# Patient Record
Sex: Female | Born: 1985 | Race: White | Hispanic: No | Marital: Married | Smoking: Never smoker
Health system: Southern US, Community
[De-identification: ages and names within clinical notes are randomized; demographics above are authoritative.]

## PROBLEM LIST (undated history)

## (undated) DIAGNOSIS — T7840XA Allergy, unspecified, initial encounter: Secondary | ICD-10-CM

## (undated) DIAGNOSIS — J302 Other seasonal allergic rhinitis: Secondary | ICD-10-CM

## (undated) HISTORY — PX: TONSILLECTOMY: SUR1361

## (undated) HISTORY — DX: Other seasonal allergic rhinitis: J30.2

## (undated) HISTORY — DX: Allergy, unspecified, initial encounter: T78.40XA

---

## 2011-09-03 LAB — HM PAP SMEAR: HM PAP: NEGATIVE

## 2014-01-30 ENCOUNTER — Ambulatory Visit (INDEPENDENT_AMBULATORY_CARE_PROVIDER_SITE_OTHER): Payer: BC Managed Care – PPO | Admitting: Family Medicine

## 2014-01-30 ENCOUNTER — Encounter: Payer: Self-pay | Admitting: Family Medicine

## 2014-01-30 VITALS — BP 105/42 | HR 58 | Ht 64.0 in | Wt 143.0 lb

## 2014-01-30 DIAGNOSIS — H6980 Other specified disorders of Eustachian tube, unspecified ear: Secondary | ICD-10-CM | POA: Insufficient documentation

## 2014-01-30 DIAGNOSIS — H6981 Other specified disorders of Eustachian tube, right ear: Secondary | ICD-10-CM | POA: Diagnosis not present

## 2014-01-30 MED ORDER — MONTELUKAST SODIUM 10 MG PO TABS: 10.0000 mg | ORAL_TABLET | Freq: Every day | ORAL | Status: DC

## 2014-01-30 NOTE — Progress Notes (Signed)
CC: Regina Hogan is a 28 y.o. female is here for Establish Care   Subjective: HPI:  Very pleasant 28 year old here to establish care, wife of Regina Hogan  Patient complains of recurrent episodes of right tonsillar swelling and right ear to neck pain that occurs 3-4 times a year. Most recently occurred about a month ago. It is described as swelling, pain with swallowing and enlargement of the right tonsil. It has improved in the past with azithromycin but this no longer helps, most recently responded to prednisone. She relates that taking an over-the-counter antihistamine helps prevent the onset. Currently she denies any complaints. When it's present she denies any choking episodes or difficulty breathing. It seems to be worse during the fall and spring when her allergies are the worst. It also seems to have improved with Flonase however this is a class C medication and she is currently in her first trimester.  Review of Systems - General ROS: negative for - chills, fever, night sweats, weight gain or weight loss Ophthalmic ROS: negative for - decreased vision Psychological ROS: negative for - anxiety or depression ENT ROS: negative for - hearing change, nasal congestion, tinnitus Hematological and Lymphatic ROS: negative for - bleeding problems, bruising or swollen lymph nodes Breast ROS: negative Respiratory ROS: no cough, shortness of breath, or wheezing Cardiovascular ROS: no chest pain or dyspnea on exertion Gastrointestinal ROS: no abdominal pain, change in bowel habits, or black or bloody stools Genito-Urinary ROS: negative for - genital discharge, genital ulcers, incontinence or abnormal bleeding from genitals Musculoskeletal ROS: negative for - joint pain or muscle pain Neurological ROS: negative for - headaches or memory loss Dermatological ROS: negative for lumps, mole changes, rash and skin lesion changes  History reviewed. No pertinent past medical history.  History reviewed. No  pertinent past surgical history. Family History  Problem Relation Age of Onset  . Cancer - Other      grandfather   . Depression      grandmother   . Hyperlipidemia Mother     History   Social History  . Marital Status: Married    Spouse Name: N/A    Number of Children: N/A  . Years of Education: N/A   Occupational History  . Not on file.   Social History Main Topics  . Smoking status: Never Smoker   . Smokeless tobacco: Not on file  . Alcohol Use: No     Comment: not currently due to pregnancy  . Drug Use: No  . Sexual Activity:    Partners: Male   Other Topics Concern  . Not on file   Social History Narrative  . No narrative on file     Objective: BP 105/42 mmHg  Pulse 58  Ht 5\' 4"  (1.626 m)  Wt 143 lb (64.864 kg)  BMI 24.53 kg/m2  General: Alert and Oriented, No Acute Distress HEENT: Pupils equal, round, reactive to light. Conjunctivae clear.  External ears unremarkable, canals clear with intact TMs with appropriate landmarks.  Middle ear appears open without effusion. Pink inferior turbinates.  Moist mucous membranes, pharynx without inflammation nor lesions with only trace right tonsillar hypertrophy. Uvula is midline.  Neck supple without palpable lymphadenopathy nor abnormal masses. Extremities: No peripheral edema.  Strong peripheral pulses.  Mental Status: No depression, anxiety, nor agitation. Skin: Warm and dry.  Assessment & Plan: Brigitt was seen today for establish care.  Diagnoses and associated orders for this visit:  Eustachian tube dysfunction, right - montelukast (SINGULAIR) 10 MG tablet;  Take 1 tablet (10 mg total) by mouth at bedtime. Once in 2nd or 3rd trimester of pregnancy.    Eustachian tube dysfunction: currently stable but influenced by fluctuating allergies therefore Discussed starting Singulair at the beginning of her second trimester provided she gets the approval of her OB/GYN office at [facility]. Hopefully this will help  prevent future recurrences for now use nasal saline washes. Invited her to return here if she has any recurrence of the swelling in the right neck.  I've asked her to return on an as-needed basis or after her pregnancy for checking cholesterol and routine health maintenance outside of the pregnancy.  Return in about 8 months (around 09/30/2014), or if symptoms worsen or fail to improve.

## 2014-07-03 ENCOUNTER — Encounter: Payer: Self-pay | Admitting: Family Medicine

## 2014-07-03 ENCOUNTER — Ambulatory Visit (INDEPENDENT_AMBULATORY_CARE_PROVIDER_SITE_OTHER): Payer: BC Managed Care – PPO | Admitting: Family Medicine

## 2014-07-03 VITALS — BP 116/66 | HR 76 | Temp 98.0°F | Wt 181.0 lb

## 2014-07-03 DIAGNOSIS — H6982 Other specified disorders of Eustachian tube, left ear: Secondary | ICD-10-CM

## 2014-07-03 MED ORDER — CEFDINIR 300 MG PO CAPS: 300.0000 mg | ORAL_CAPSULE | Freq: Two times a day (BID) | ORAL | Status: AC

## 2014-07-03 NOTE — Progress Notes (Signed)
CC: Regina Hogan is a 29 y.o. female is here for left ear pain   Subjective: HPI:  Complains of left throat pain and swelling that has been present since Friday last week. Pain is described as sharp, moderate in severity worse when lying down at night and radiates into the left ear. She's tried Tylenol which mildly helps improve her pain. She denies fevers, chills, cough, shortness of breath, difficulty swallowing. She denies rash. Symptoms are worse when swallowing. She has a sensation of drainage down the back of her throat when lying down at night but denies nasal congestion.   Review Of Systems Outlined In HPI  No past medical history on file.  No past surgical history on file. Family History  Problem Relation Age of Onset  . Cancer - Other      grandfather   . Depression      grandmother   . Hyperlipidemia Mother     History   Social History  . Marital Status: Married    Spouse Name: N/A  . Number of Children: N/A  . Years of Education: N/A   Occupational History  . Not on file.   Social History Main Topics  . Smoking status: Never Smoker   . Smokeless tobacco: Not on file  . Alcohol Use: No     Comment: not currently due to pregnancy  . Drug Use: No  . Sexual Activity:    Partners: Male   Other Topics Concern  . Not on file   Social History Narrative  . No narrative on file     Objective: BP 116/66 mmHg  Pulse 76  Temp(Src) 98 F (36.7 C) (Oral)  Wt 181 lb (82.101 kg)  General: Alert and Oriented, No Acute Distress HEENT: Pupils equal, round, reactive to light. Conjunctivae clear.  External ears unremarkable, canals clear with intact TMs with appropriate landmarks.  Middle ear appears open without effusion. Pink inferior turbinates.  Moist mucous membranes, pharynx without lesions, uvula is midline, left tonsil is moderately erythematous and somewhat larger than the right..  Neck supple without palpable lymphadenopathy nor abnormal masses. Lungs:  Clear to auscultation bilaterally, no wheezing/ronchi/rales.  Comfortable work of breathing. Good air movement. Extremities: No peripheral edema.  Strong peripheral pulses.  Mental Status: No depression, anxiety, nor agitation. Skin: Warm and dry.  Assessment & Plan: Regina Hogan was seen today for left ear pain.  Diagnoses and all orders for this visit:  Eustachian tube dysfunction, left Orders: -     cefdinir (OMNICEF) 300 MG capsule; Take 1 capsule (300 mg total) by mouth 2 (two) times daily.   Tonsillitis causing eustachian tube dysfunction with question of bacterial sinusitis therefore start Cementon. She is [redacted] weeks pregnant which prohibits use of nonsteroidal anti-inflammatories or prednisone.  She's currently not taking Singulair or hernasal steroid, continue holding off on these and do not start Singulair after delivering if desiring to breast-feed.  Return if symptoms worsen or fail to improve.

## 2014-07-04 ENCOUNTER — Telehealth: Payer: Self-pay | Admitting: *Deleted

## 2014-07-04 NOTE — Telephone Encounter (Signed)
This is ok because the cefdinir that I Rxed is in the same class as cephalexin that was given to her by a former provider and she didn't have a allergic reaction to it. This was on some of the paperwork she showed me on Wednesday.

## 2014-07-04 NOTE — Telephone Encounter (Signed)
Pt aware.

## 2014-07-04 NOTE — Telephone Encounter (Signed)
Pt called and states the pharmacist asked if she had a PCN allergy. She wanted to double check that its safe for her to take this med with PCN allergy as a child and pregnancy

## 2015-02-26 ENCOUNTER — Encounter: Payer: Self-pay | Admitting: Family Medicine

## 2015-02-26 ENCOUNTER — Other Ambulatory Visit: Payer: Self-pay

## 2015-02-26 ENCOUNTER — Ambulatory Visit: Payer: BC Managed Care – PPO | Admitting: Family Medicine

## 2015-02-26 ENCOUNTER — Ambulatory Visit (INDEPENDENT_AMBULATORY_CARE_PROVIDER_SITE_OTHER): Payer: BC Managed Care – PPO | Admitting: Family Medicine

## 2015-02-26 VITALS — BP 114/71 | HR 74 | Wt 147.0 lb

## 2015-02-26 DIAGNOSIS — R251 Tremor, unspecified: Secondary | ICD-10-CM

## 2015-02-26 DIAGNOSIS — R42 Dizziness and giddiness: Secondary | ICD-10-CM

## 2015-02-26 LAB — COMPLETE METABOLIC PANEL WITH GFR
ALT: 12 U/L (ref 6–29)
AST: 17 U/L (ref 10–30)
Albumin: 3.9 g/dL (ref 3.6–5.1)
Alkaline Phosphatase: 32 U/L — ABNORMAL LOW (ref 33–115)
BILIRUBIN TOTAL: 1.4 mg/dL — AB (ref 0.2–1.2)
BUN: 14 mg/dL (ref 7–25)
CHLORIDE: 100 mmol/L (ref 98–110)
CO2: 21 mmol/L (ref 20–31)
CREATININE: 0.7 mg/dL (ref 0.50–1.10)
Calcium: 9.1 mg/dL (ref 8.6–10.2)
GFR, Est African American: >89 mL/min (ref >=60)
GFR, Est Non African American: >89 mL/min (ref >=60)
GLUCOSE: 90 mg/dL (ref 65–99)
Potassium: 3.6 mmol/L (ref 3.5–5.3)
SODIUM: 137 mmol/L (ref 135–146)
TOTAL PROTEIN: 6.8 g/dL (ref 6.1–8.1)

## 2015-02-26 LAB — HEMOGLOBIN: Hemoglobin: 14.2 g/dL (ref 12.0–15.0)

## 2015-02-26 MED ORDER — PREDNISONE 20 MG PO TABS: ORAL_TABLET | ORAL | Status: AC

## 2015-02-26 MED ORDER — SODIUM CHLORIDE 0.9 % IV SOLN: 125.0000 mg | Freq: Once | INTRAVENOUS | Status: DC

## 2015-02-26 MED ORDER — METHYLPREDNISOLONE SODIUM SUCC 125 MG IJ SOLR
125.0000 mg | Freq: Once | INTRAMUSCULAR | Status: AC
  Administered 2015-02-26: 125 mg via INTRAMUSCULAR

## 2015-02-26 NOTE — Addendum Note (Signed)
Addended by: Delrae Alfred on: 02/26/2015 11:34 AM   Modules accepted: Orders

## 2015-02-26 NOTE — Progress Notes (Signed)
CC: Regina Hogan is a 29 y.o. female is here for Ear Fullness   Subjective: HPI:   complains of a sensation of  Mild dizziness and ear fullness, further described as feeling like she needs to to pressurize her ears. Symptoms have happened today and yesterday. They come on late in the morning after she's been awake and had breakfast, it comes on without warning. Usually lasts a few hours and then goes away. She feels like she is in her regular state of health otherwise. Symptoms are mild in severity. Nothing seems to make it better but it's worse when closing her eyes.  She denies sinus pressure, sore throat, cough, visual disturbance or any other motor or sensory disturbances. Denies change in hearing. Denies ringing in the ears. No interventions as of yet.   Review Of Systems Outlined In HPI  No past medical history on file.  No past surgical history on file. Family History  Problem Relation Age of Onset  . Cancer - Other      grandfather   . Depression      grandmother   . Hyperlipidemia Mother     Social History   Social History  . Marital Status: Married    Spouse Name: N/A  . Number of Children: N/A  . Years of Education: N/A   Occupational History  . Not on file.   Social History Main Topics  . Smoking status: Never Smoker   . Smokeless tobacco: Not on file  . Alcohol Use: No     Comment: not currently due to pregnancy  . Drug Use: No  . Sexual Activity:    Partners: Male   Other Topics Concern  . Not on file   Social History Narrative     Objective: BP 114/71 mmHg  Pulse 74  Wt 147 lb (66.679 kg)  General: Alert and Oriented, No Acute Distress HEENT: Pupils equal, round, reactive to light. Conjunctivae clear.  External ears unremarkable, canals clear with intact TMs with appropriate landmarks.  Middle ear appears open without effusion. Pink inferior turbinates.  Moist mucous membranes, pharynx without inflammation nor lesions.  Neck supple without  palpable lymphadenopathy nor abnormal masses. Lungs:   Clear and  Comfortable work of breathing Cardiac: Regular rate and rhythm. No murmurs Neuro: CN II-XII grossly intact, full strength/rom of all four extremities, C5/L4/S1 DTRs 2/4 bilaterally, gait normal, rapid alternating movements normal, heel-shin test normal, Rhomberg abnormal. gait normal. Extremities: No peripheral edema.  Strong peripheral pulses.  Mild tremor with hand extension. Mental Status: No depression, anxiety, nor agitation. Skin: Warm and dry.  Assessment & Plan: Lucrezia was seen today for ear fullness.  Diagnoses and all orders for this visit:  Tremor -     COMPLETE METABOLIC PANEL WITH GFR -     Hemoglobin  Dizziness -     COMPLETE METABOLIC PANEL WITH GFR -     Hemoglobin -     predniSONE (DELTASONE) 20 MG tablet; Three tabs at once daily for five days.   Tremor: Rule out hepatic or kidney abnormality, rule out hypoglycemia. Dizziness is most likely due to eustachian tube dysfunction therefore she was given a injection of Solu-Medrol today and started on a five-day course of prednisone. Will rule out electrolyte abnormality along with anemia.   Return if symptoms worsen or fail to improve.

## 2015-03-07 ENCOUNTER — Ambulatory Visit (INDEPENDENT_AMBULATORY_CARE_PROVIDER_SITE_OTHER): Payer: BC Managed Care – PPO | Admitting: Family Medicine

## 2015-03-07 ENCOUNTER — Encounter: Payer: Self-pay | Admitting: Family Medicine

## 2015-03-07 VITALS — BP 120/78 | HR 67 | Wt 143.0 lb

## 2015-03-07 DIAGNOSIS — G44209 Tension-type headache, unspecified, not intractable: Secondary | ICD-10-CM | POA: Diagnosis not present

## 2015-03-07 MED ORDER — METHOCARBAMOL 500 MG PO TABS: 500.0000 mg | ORAL_TABLET | Freq: Four times a day (QID) | ORAL | Status: DC | PRN

## 2015-03-07 MED ORDER — TRAMADOL HCL 50 MG PO TABS: 50.0000 mg | ORAL_TABLET | Freq: Three times a day (TID) | ORAL | Status: DC | PRN

## 2015-03-07 NOTE — Progress Notes (Signed)
CC: Regina Hogan is a 29 y.o. female is here for Headache   Subjective: HPI:   Headache that she woke up with but not from on Tuesday. Symptoms are persistent ever since onset. No benefit from nonsteroidal anti-inflammatories, tramadol helps somewhat. It also helps if she holds her head with her fingers or if she gets massages on her neck. She tells me it feels like she has a band wrapped around her head. She denies worse headache of her life or any motor or sensory disturbances. She's no longer his parents and dizziness or tremor after taking prednisone. She denies visual disturbance or weakness. She denies any recent or remote exertion or trauma.   Review Of Systems Outlined In HPI  No past medical history on file.  No past surgical history on file. Family History  Problem Relation Age of Onset  . Cancer - Other      grandfather   . Depression      grandmother   . Hyperlipidemia Mother     Social History   Social History  . Marital Status: Married    Spouse Name: N/A  . Number of Children: N/A  . Years of Education: N/A   Occupational History  . Not on file.   Social History Main Topics  . Smoking status: Never Smoker   . Smokeless tobacco: Not on file  . Alcohol Use: No     Comment: not currently due to pregnancy  . Drug Use: No  . Sexual Activity:    Partners: Male   Other Topics Concern  . Not on file   Social History Narrative     Objective: BP 120/78 mmHg  Pulse 67  Wt 143 lb (64.864 kg)  General: Alert and Oriented, No Acute Distress HEENT: Pupils equal, round, reactive to light. Conjunctivae clear.  External ears unremarkable, canals clear with intact TMs with appropriate landmarks.  Middle ear appears open without effusion. Pink inferior turbinates.  Moist mucous membranes, pharynx without inflammation nor lesions.  Neck supple without palpable lymphadenopathy nor abnormal masses. Neuro: CN II-XII grossly intact, full strength/rom of all four  extremities, C5/L4/S1 DTRs 2/4 bilaterally, gait normal, rapid alternating movements normal, heel-shin test normal, Rhomberg normal. Extremities: No peripheral edema.  Strong peripheral pulses.  Mental Status: No depression, anxiety, nor agitation. Skin: Warm and dry.  Assessment & Plan: Regina Hogan was seen today for headache.  Diagnoses and all orders for this visit:  Tension headache  Other orders -     traMADol (ULTRAM) 50 MG tablet; Take 1 tablet (50 mg total) by mouth every 8 (eight) hours as needed. -     methocarbamol (ROBAXIN) 500 MG tablet; Take 1 tablet (500 mg total) by mouth 4 (four) times daily as needed (headache).   Tension headache: Start Robaxin may continue tramadol and continue getting massages at least once a day.Signs and symptoms requring emergent/urgent reevaluation were discussed with the patient.   Return if symptoms worsen or fail to improve.

## 2015-03-24 ENCOUNTER — Encounter: Payer: Self-pay | Admitting: Family Medicine

## 2015-03-28 ENCOUNTER — Ambulatory Visit (INDEPENDENT_AMBULATORY_CARE_PROVIDER_SITE_OTHER): Payer: BC Managed Care – PPO | Admitting: Family Medicine

## 2015-03-28 ENCOUNTER — Encounter: Payer: Self-pay | Admitting: Family Medicine

## 2015-03-28 ENCOUNTER — Ambulatory Visit: Payer: BC Managed Care – PPO | Admitting: Family Medicine

## 2015-03-28 VITALS — BP 112/73 | HR 80 | Wt 143.0 lb

## 2015-03-28 DIAGNOSIS — R51 Headache: Secondary | ICD-10-CM | POA: Diagnosis not present

## 2015-03-28 DIAGNOSIS — R519 Headache, unspecified: Secondary | ICD-10-CM

## 2015-03-28 MED ORDER — MAGNESIUM 300 MG PO CAPS: ORAL_CAPSULE | ORAL | Status: AC

## 2015-03-28 NOTE — Progress Notes (Signed)
CC: Regina Hogan is a 30 y.o. female is here for Continued Headaches   Subjective: HPI:  Continues to have a headache most days of the week.  Initially improved after seeing me last and getting a massage however returned on the 2nd of January.  Felt in the back of the neck, back of the scalp, improves with pressing on the site of pain.  Nothing else makes better or wors other than acetaminophen slightly improving headache. She denies awakening because of the headache, she denies worst headache of life, she denies vision disturbance or confusion. There's been no fevers, chills, nasal congestion, sore throat or rash. She denies coordination difficulties or tremor. She denies any radiation of her pain in the arms. There are some days she'll have no headache whatsoever.   Review Of Systems Outlined In HPI  No past medical history on file.  No past surgical history on file. Family History  Problem Relation Age of Onset  . Cancer - Other      grandfather   . Depression      grandmother   . Hyperlipidemia Mother     Social History   Social History  . Marital Status: Married    Spouse Name: N/A  . Number of Children: N/A  . Years of Education: N/A   Occupational History  . Not on file.   Social History Main Topics  . Smoking status: Never Smoker   . Smokeless tobacco: Not on file  . Alcohol Use: No     Comment: not currently due to pregnancy  . Drug Use: No  . Sexual Activity:    Partners: Male   Other Topics Concern  . Not on file   Social History Narrative     Objective: BP 112/73 mmHg  Pulse 80  Wt 143 lb (64.864 kg)  Vital signs reviewed. General: Alert and Oriented, No Acute Distress HEENT: Pupils equal, round, reactive to light. Conjunctivae clear.  External ears unremarkable.  Moist mucous membranes. Lungs: Clear and comfortable work of breathing, speaking in full sentences without accessory muscle use. Cardiac: Regular rate and rhythm.  Neuro: CN II-XII  grossly intact, full strength/rom of all four extremities, C5/L4/S1 DTRs 2/4 bilaterally, gait normal, rapid alternating movements normal, heel-shin test normal, Rhomberg normal. Extremities: No peripheral edema.  Strong peripheral pulses.  Mental Status: No depression, anxiety, nor agitation. Logical though process. Skin: Warm and dry. Assessment & Plan: Andie was seen today for continued headaches.  Diagnoses and all orders for this visit:  New onset headache -     CT Head Wo Contrast; Future -     Magnesium 300 MG CAPS; One by mouth daily.   I really feel like her headache is still most likely due to a tension headache. If she does not get better after starting the magnesium supplement every day or his acute worsening of her headache I would like her to get a CT scan on Monday .I've asked her also to update me on Monday if she is doing better or worse.   Return if symptoms worsen or fail to improve.

## 2015-03-31 ENCOUNTER — Ambulatory Visit (HOSPITAL_BASED_OUTPATIENT_CLINIC_OR_DEPARTMENT_OTHER)
Admission: RE | Admit: 2015-03-31 | Discharge: 2015-03-31 | Disposition: A | Discharge status: Home / Self Care | Payer: BC Managed Care – PPO | Source: Ambulatory Visit | Attending: Family Medicine | Admitting: Family Medicine

## 2015-03-31 ENCOUNTER — Ambulatory Visit (HOSPITAL_BASED_OUTPATIENT_CLINIC_OR_DEPARTMENT_OTHER): Admission: RE | Admit: 2015-03-31 | Payer: BC Managed Care – PPO | Source: Ambulatory Visit

## 2015-03-31 ENCOUNTER — Encounter (HOSPITAL_BASED_OUTPATIENT_CLINIC_OR_DEPARTMENT_OTHER): Payer: Self-pay

## 2015-03-31 ENCOUNTER — Telehealth: Payer: Self-pay | Admitting: Family Medicine

## 2015-03-31 DIAGNOSIS — R519 Headache, unspecified: Secondary | ICD-10-CM

## 2015-03-31 DIAGNOSIS — R42 Dizziness and giddiness: Secondary | ICD-10-CM | POA: Insufficient documentation

## 2015-03-31 DIAGNOSIS — R51 Headache: Secondary | ICD-10-CM | POA: Insufficient documentation

## 2015-03-31 MED ORDER — AMITRIPTYLINE HCL 10 MG PO TABS: 10.0000 mg | ORAL_TABLET | Freq: Every day | ORAL | Status: DC

## 2015-03-31 NOTE — Telephone Encounter (Signed)
Will you please let patient know that her CT scan was normal and reassuring.  Since i assume she's still having headaches I'll send a headache preventative medicine called amitriptyline to her [facility].

## 2015-04-01 NOTE — Telephone Encounter (Signed)
Pt.notified

## 2015-04-01 NOTE — Telephone Encounter (Signed)
Pt does not have a vm. 

## 2015-04-07 ENCOUNTER — Encounter: Payer: Self-pay | Admitting: Family Medicine

## 2015-04-26 ENCOUNTER — Other Ambulatory Visit: Payer: Self-pay | Admitting: Family Medicine

## 2015-05-07 ENCOUNTER — Encounter: Payer: Self-pay | Admitting: Family Medicine

## 2015-05-09 MED ORDER — NORETHIN-ETH ESTRAD-FE BIPHAS 1 MG-10 MCG / 10 MCG PO TABS: 1.0000 | ORAL_TABLET | Freq: Every day | ORAL | Status: DC

## 2015-06-21 ENCOUNTER — Other Ambulatory Visit: Payer: Self-pay | Admitting: Family Medicine

## 2015-07-21 ENCOUNTER — Encounter: Payer: Self-pay | Admitting: Family Medicine

## 2015-07-21 MED ORDER — NORETHIN ACE-ETH ESTRAD-FE 1-20 MG-MCG(24) PO CAPS: 1.0000 | ORAL_CAPSULE | Freq: Every day | ORAL | Status: DC

## 2015-09-30 ENCOUNTER — Ambulatory Visit (INDEPENDENT_AMBULATORY_CARE_PROVIDER_SITE_OTHER): Payer: BC Managed Care – PPO | Admitting: Family Medicine

## 2015-09-30 ENCOUNTER — Encounter: Payer: Self-pay | Admitting: Family Medicine

## 2015-09-30 VITALS — BP 107/71 | HR 57 | Wt 142.0 lb

## 2015-09-30 DIAGNOSIS — Z23 Encounter for immunization: Secondary | ICD-10-CM

## 2015-09-30 DIAGNOSIS — Z0000 Encounter for general adult medical examination without abnormal findings: Secondary | ICD-10-CM

## 2015-09-30 DIAGNOSIS — Z021 Encounter for pre-employment examination: Secondary | ICD-10-CM

## 2015-09-30 DIAGNOSIS — Z111 Encounter for screening for respiratory tuberculosis: Secondary | ICD-10-CM | POA: Diagnosis not present

## 2015-09-30 NOTE — Addendum Note (Signed)
Addended by: Delrae Alfred on: 09/30/2015 10:00 AM   Modules accepted: Orders, SmartSet

## 2015-09-30 NOTE — Progress Notes (Signed)
CC: Regina Hogan is a 30 y.o. 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 She needs a form filled out stating that she is healthy to start this position. She tells me that she needs a TDAP given since it's been greater than 1 year since her last TdAP. She also has to have a PPD placed. She has no complaints today.  Review of Systems - General ROS: negative for - chills, fever, night sweats, weight gain or weight loss Ophthalmic ROS: negative for - decreased vision Psychological ROS: negative for - anxiety or depression ENT ROS: negative for - hearing change, nasal congestion, tinnitus or allergies Hematological and Lymphatic ROS: negative for - bleeding problems, bruising or swollen lymph nodes Breast ROS: negative Respiratory ROS: no cough, shortness of breath, or wheezing Cardiovascular ROS: no chest pain or dyspnea on exertion Gastrointestinal ROS: no abdominal pain, change in bowel habits, or black or bloody stools Genito-Urinary ROS: negative for - genital discharge, genital ulcers, incontinence or abnormal bleeding from genitals Musculoskeletal ROS: negative for - joint pain or muscle pain Neurological ROS: negative for - headaches or memory loss Dermatological ROS: negative for lumps, mole changes, rash and skin lesion changes  History reviewed. No pertinent past medical history.  History reviewed. No pertinent past surgical history. Family History  Problem Relation Age of Onset  . Cancer - Other      grandfather   . Depression      grandmother   . Hyperlipidemia Mother     Social History   Social History  . Marital Status: Married    Spouse Name: N/A  . Number of Children: N/A  . Years of Education: N/A   Occupational History  . Not on file.   Social History Main Topics  . Smoking status: Never Smoker   . Smokeless tobacco: Not on file  . Alcohol Use: No     Comment: not currently  due to pregnancy  . Drug Use: No  . Sexual Activity:    Partners: Male   Other Topics Concern  . Not on file   Social History Narrative     Objective: BP 107/71 mmHg  Pulse 57  Wt 142 lb (64.411 kg)  General: Alert and Oriented, No Acute Distress HEENT: Pupils equal, round, reactive to light. Conjunctivae clear.  External ears unremarkable, canals clear with intact TMs with appropriate landmarks.  Middle ear appears open without effusion. Pink inferior turbinates.  Moist mucous membranes, pharynx without inflammation nor lesions.  Neck supple without palpable lymphadenopathy nor abnormal masses. Lungs: Clear to auscultation bilaterally, no wheezing/ronchi/rales.  Comfortable work of breathing. Good air movement. Cardiac: Regular rate and rhythm. Normal S1/S2.  No murmurs, rubs, nor gallops.   Extremities: No peripheral edema.  Strong peripheral pulses.  Mental Status: No depression, anxiety, nor agitation. Skin: Warm and dry.  Assessment & Plan: Regina Hogan was seen today for annual exam.  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 She'll return on Thursday or Friday to have her PPD read. I will be out of the office and will leave her signed form in my inbox for one our assistants to complete for her.  While leaving she felt lightheaded and had a vasovagal syncope episode. She was caught by myself and [place] and did  not hit any structure with any force.  She was unresponsive for less than a second and had a normal BP and pulse once her legs were lifted and she was lying on the floor.  She was observed back in her normal state of health without any complaints for 15 minutes before leaving.  She reports this has happened in the past with injections.   Return for Thursday or Friday Nurse Visit (PPD).

## 2015-09-30 NOTE — Addendum Note (Signed)
Addended by: Delrae Alfred on: 09/30/2015 10:00 AM   Modules accepted: Miquel Dunn

## 2015-10-02 ENCOUNTER — Ambulatory Visit (INDEPENDENT_AMBULATORY_CARE_PROVIDER_SITE_OTHER): Payer: BC Managed Care – PPO | Admitting: Sports Medicine

## 2015-10-02 VITALS — BP 108/66 | HR 68 | Wt 142.0 lb

## 2015-10-02 DIAGNOSIS — Z111 Encounter for screening for respiratory tuberculosis: Secondary | ICD-10-CM

## 2015-10-02 DIAGNOSIS — Z7689 Persons encountering health services in other specified circumstances: Secondary | ICD-10-CM | POA: Diagnosis not present

## 2015-10-02 LAB — TB SKIN TEST
INDURATION: 0 mm
TB Skin Test: NEGATIVE

## 2015-10-02 NOTE — Progress Notes (Signed)
   Subjective:    Patient ID: Regina Hogan, female    DOB: 09-10-1985, 30 y.o.   MRN: [id number]  HPI  Regina Hogan presents to the clinic for a PPD reading.  Denies any redness, SOB, and fever.  There is a bruise where the injection was given. Pt reading was negative 0 mm. -[facility]/RMA  Review of Systems     Objective:   Physical Exam        Assessment & Plan:

## 2016-01-16 ENCOUNTER — Encounter: Payer: Self-pay | Admitting: Osteopathic Medicine

## 2016-01-16 ENCOUNTER — Ambulatory Visit (INDEPENDENT_AMBULATORY_CARE_PROVIDER_SITE_OTHER): Payer: BC Managed Care – PPO | Admitting: Osteopathic Medicine

## 2016-01-16 VITALS — BP 110/75 | HR 82 | Temp 98.6°F | Ht 64.0 in | Wt 146.0 lb

## 2016-01-16 DIAGNOSIS — J029 Acute pharyngitis, unspecified: Secondary | ICD-10-CM | POA: Diagnosis not present

## 2016-01-16 MED ORDER — LIDOCAINE VISCOUS HCL 2 % MT SOLN: 4.5000 mg/kg | OROMUCOSAL | 0 refills | Status: DC | PRN

## 2016-01-16 MED ORDER — CEPHALEXIN 500 MG PO TABS: 500.0000 mg | ORAL_TABLET | Freq: Two times a day (BID) | ORAL | 0 refills | Status: DC

## 2016-01-16 NOTE — Progress Notes (Signed)
HPI: Regina Hogan is a 30 y.o. female who presents to [place] 01/16/16 for chief complaint of:  Chief Complaint  Patient presents with  . Sore Throat  . Ear Pain  . Fever    Acute Illness: . Context: went to [facility] over this week Given azithromycin, doesn't seem to make any difference . Location: Throat/pharynx . Quality: achiness, fever, ear pain and sore throat  . Assoc signs/symptoms: see ROS    Past medical, social and family history reviewed. Current medications and allergies reviewed.     Review of Systems:  Constitutional: no fever/chills now but +fever earlier  HEENT: no headache, no vision change or hearing change, yes sore throat  Cardiovascular: No chest pain, No pressure/palpitations  Respiratory: no cough, no shortness of breath  Gastrointestinal: no nausea, no vomiting, no abdominal pain, no diarrhea  Musculoskeletal: no myalgia/arthralgia  Skin/Integument: no rash   Exam:  BP 110/75   Pulse 82   Temp 98.6 F (37 C) (Oral)   Ht 5\' 4"  (1.626 m)   Wt 146 lb (66.2 kg)   BMI 25.06 kg/m   Constitutional: VSS, see above. General Appearance: alert, well-developed, well-nourished, NAD  Eyes: Normal lids and conjunctive, non-icteric sclera, PERRLA  Ears, Nose, Mouth, Throat: Normal external inspection ears/nares/mouth/lips/gums, normal TM, MMM; posterior pharynx with erythema, without exudate, nasal mucosa normal  Neck: No masses, trachea midline. tender anterior lymph nodes  Respiratory: Normal respiratory effort. No  wheeze/rhonchi/rales  Cardiovascular: S1/S2 normal, no murmur/rub/gallop auscultated. RRR.  MODIFIED CENTOR CRITERIA (ponts if "yes"): Tonsillar erythema/exudate (1): yes Tender Ant Cervical LN (1): yes Absence of cough (1): yes Fever (1): yes Age  81-14 (1): no 15-45 (0): yes >/= 45 (-1): no SCORE: 4    ASSESSMENT/PLAN: Meeting Centor criteria for strep pharyngitis,  supportive care and antibiotics advised.  Acute pharyngitis, unspecified etiology - Plan: Lidocaine HCl 2 % SOLN, Cephalexin 500 MG tablet    Visit summary was printed for the patient with medications and pertinent instructions for patient to review. ER/RTC precautions reviewed. All questions answered. Return if symptoms worsen or fail to improve.

## 2016-02-06 ENCOUNTER — Ambulatory Visit: Payer: BC Managed Care – PPO | Admitting: Osteopathic Medicine

## 2016-02-17 ENCOUNTER — Ambulatory Visit (INDEPENDENT_AMBULATORY_CARE_PROVIDER_SITE_OTHER): Payer: BC Managed Care – PPO | Admitting: Osteopathic Medicine

## 2016-02-17 ENCOUNTER — Encounter: Payer: Self-pay | Admitting: Osteopathic Medicine

## 2016-02-17 VITALS — BP 110/56 | HR 60 | Ht 64.0 in | Wt 149.0 lb

## 2016-02-17 DIAGNOSIS — N926 Irregular menstruation, unspecified: Secondary | ICD-10-CM | POA: Diagnosis not present

## 2016-02-17 MED ORDER — NORGESTREL-ETHINYL ESTRADIOL 0.3-30 MG-MCG PO TABS: 1.0000 | ORAL_TABLET | Freq: Every day | ORAL | 11 refills | Status: DC

## 2016-02-17 NOTE — Progress Notes (Signed)
HPI: Regina Hogan is a 30 y.o. female  who presents to [place] today, 02/17/16,  for chief complaint of:  Chief Complaint  Patient presents with  . Establish Care    switching form [place]/ irregular periods     . Context: Irregular bleeding on birth control . Quality: Spotting between periods  . Duration: 6 months  . Modifying factors: Reviewed previous medication history for other birth control tried. Patient did well on Cryselle, this was stopped due to concern for headaches however patient was initiating several other headache therapies and stress modification techniques at the time, can't be sure that birth control change was what resolved the headache issue . Assoc signs/symptoms: No abdominal pain    Past medical, surgical, social and family history reviewed: Patient Active Problem List   Diagnosis Date Noted  . Eustachian tube dysfunction 01/30/2014   History reviewed. No pertinent surgical history. Social History  Substance Use Topics  . Smoking status: Never Smoker  . Smokeless tobacco: Never Used  . Alcohol use No     Comment: not currently due to pregnancy   Family History  Problem Relation Age of Onset  . Cancer - Other      grandfather   . Depression      grandmother   . Hyperlipidemia Mother      Current medication list and allergy/intolerance information reviewed:   Current Outpatient Prescriptions on File Prior to Visit  Medication Sig Dispense Refill  . fluticasone (FLONASE) 50 MCG/ACT nasal spray Place into both nostrils daily.    . Levocetirizine Dihydrochloride (XYZAL PO) Take by mouth.    . Lidocaine HCl 2 % SOLN Use as directed 4.5 mg/kg in the mouth or throat every 3 (three) hours as needed (mouth/throat pain - gargle and spit). 100 mL 0  . Magnesium 300 MG CAPS One by mouth daily.  0  . Multiple Vitamins-Minerals (VITAMENT PO) Take by mouth.    . Probiotic Product (ACIDOPHILUS/GOAT MILK) CAPS Take by  mouth.    . Riboflavin (VITAMIN B2 PO) Take by mouth.    . TAYTULLA 1-20 MG-MCG(24) CAPS Take 1 capsule by mouth daily.  11   No current facility-administered medications on file prior to visit.    Allergies  Allergen Reactions  . Penicillins Rash    As a child      Review of Systems:  Constitutional: No recent illness  Cardiac: No  chest pain  Respiratory:  No  shortness of breath.   Gastrointestinal: No  abdominal pain  Musculoskeletal: No new myalgia/arthralgia  Skin: No  Rash  Neurologic: No  weakness, No  Dizziness  Exam:  BP (!) 110/56   Pulse 60   Ht 5\' 4"  (1.626 m)   Wt 149 lb (67.6 kg)   BMI 25.58 kg/m   Constitutional: VS see above. General Appearance: alert, well-developed, well-nourished, NAD  Eyes: Normal lids and conjunctive, non-icteric sclera  Ears, Nose, Mouth, Throat: MMM, Normal external inspection ears/nares/mouth/lips/gums.  Neck: No masses, trachea midline.   Respiratory: Normal respiratory effort.   Musculoskeletal: Gait normal. Symmetric and independent movement of all extremities  Neurological: Normal balance/coordination. No tremor.  Skin: warm, dry, intact.   Psychiatric: Normal judgment/insight. Normal mood and affect. Oriented x3.      ASSESSMENT/PLAN:   Switch back to previous birth control, if headaches recur, consider changing birth control again, would like to maintain higher estrogen component as low estrogen seems to cause irregular bleeding in her case  Irregular bleeding - Plan: norgestrel-ethinyl estradiol (LO/OVRAL,CRYSELLE) 0.3-30 MG-MCG tablet      Visit summary with medication list and pertinent instructions was printed for patient to review. All questions at time of visit were answered - patient instructed to contact office with any additional concerns. ER/RTC precautions were reviewed with the patient. Follow-up plan: No Follow-up on file.

## 2016-02-17 NOTE — Patient Instructions (Signed)
Aches/Pains, Fever Acetaminophen (Tylenol) 500 mg tablets - take max 2 tablets (1000 mg) every 6 hours (4 times per day)  Ibuprofen (Motrin) 200 mg tablets - take max 4 tablets (800 mg) every 6 hours  Sinus Congestion Cromolyn Nasal Spray (NasalCrom) 1 spray each nostril 3-4 times per day, max 6 imes per day Nasal Saline if desired Oxymetolazone (Afrin, others) sparing use due to rebound congestion Phenylephrine PE (Sudafed) 10 mg tablets every 4 hours (or the 12-hour formulation) Diphenhydramine (Benadryl) 25 mg tablets - take max 2 tablets every 4 hours  Cough Dextromethorphan (Robitussin, others) - cough suppressant Guaifenesin (Robitussin, Mucinex, others) - expectorant (helps cough up mucus) The above medications also come in a combination tablet Lozenges w/ Benzocaine + Menthol (Cepacol) Honey - as much as you want Teas which "coat the throat" - look for ingredients Elm Bark, Licorice Root, Marshmallow Root  Other Zinc Lozenges within 24 hours of symptoms onset - mixed evidence this shortens the duration of the common cold Don't waste your money on Vitamin C or Echinacea

## 2016-06-08 ENCOUNTER — Ambulatory Visit (INDEPENDENT_AMBULATORY_CARE_PROVIDER_SITE_OTHER): Payer: BC Managed Care – PPO | Admitting: Family Medicine

## 2016-06-08 DIAGNOSIS — J302 Other seasonal allergic rhinitis: Secondary | ICD-10-CM | POA: Diagnosis not present

## 2016-06-08 DIAGNOSIS — J039 Acute tonsillitis, unspecified: Secondary | ICD-10-CM | POA: Insufficient documentation

## 2016-06-08 HISTORY — DX: Other seasonal allergic rhinitis: J30.2

## 2016-06-08 MED ORDER — MONTELUKAST SODIUM 10 MG PO TABS: 10.0000 mg | ORAL_TABLET | Freq: Every day | ORAL | 12 refills | Status: DC

## 2016-06-08 MED ORDER — CEFDINIR 300 MG PO CAPS: 300.0000 mg | ORAL_CAPSULE | Freq: Two times a day (BID) | ORAL | 0 refills | Status: DC

## 2016-06-08 NOTE — Patient Instructions (Signed)
Thank you for coming in today. Call or go to the emergency room if you get worse, have trouble breathing, have chest pains, or palpitations.  Take omnicef twice daily.  Start singulair.  Continue flonase and zyrtec.  Return as needed.  Call or go to the emergency room if you get worse, have trouble breathing, have chest pains, or palpitations.    Tonsillitis Tonsillitis is an infection of the throat. This infection causes the tonsils to become red, tender, and swollen. Tonsils are tissues in the back of your throat. If bacteria caused your infection, antibiotic medicine will be given to you. Sometimes, symptoms of this infection can be helped with the use of steroid medicine. If your tonsillitis is very bad (severe) and happens often, you may need to get your tonsils removed (tonsillectomy). Follow these instructions at home: Medicines   Take over-the-counter and prescription medicines only as told by your doctor.  If you were prescribed an antibiotic, take it as told by your doctor. Do not stop taking the antibiotic even if you start to feel better. Eating and drinking   Drink enough fluid to keep your pee (urine) clear or pale yellow.  While your throat is sore, eat soft or liquid foods like:  Soup.  Sherbert.  Instant breakfast drinks.  Drink warm fluids.  Eat frozen ice pops. General instructions   Rest as much as possible and get plenty of sleep.  Gargle with a salt-water mixture 3-4 times a day or as needed. To make a salt-water mixture, completely dissolve -1 tsp of salt in 1 cup of warm water.  Wash your hands often with soap and water. If there is no soap and water, use hand sanitizer.  Do not share cups, bottles, or other utensils until your symptoms are gone.  Do not smoke. If you need help quitting, ask your doctor.  Keep all follow-up visits as told by your doctor. This is important. Contact a doctor if:  You have large, tender lumps in your neck.  You  have a fever that does not go away after 2-3 days.  You have a rash.  You cough up green, yellow-brown, or bloody fluid.  You cannot swallow liquids or food for 24 hours.  Only one of your tonsils is swollen. Get help right away if:  You have any new symptoms such as:  Vomiting  Very bad headache  Stiff neck  Chest pain  Trouble breathing or swallowing  You have very bad throat pain and also have drooling or voice changes.  You have very bad pain that is not helped by medicine.  You cannot fully open your mouth.  You have redness, swelling, or severe pain anywhere in your neck. Summary  Tonsillitis causes your tonsils to be red, tender, and swollen.  While your throat is sore eat soft or liquid foods.  Gargle with a salt-water mixture 3-4 times a day or as needed.  Do not share cups, bottles, or other utensils until your symptoms are gone. This information is not intended to replace advice given to you by your health care provider. Make sure you discuss any questions you have with your health care provider. Document Released: 08/25/2007 Document Revised: 08/14/2015 Document Reviewed: 08/25/2012 Elsevier Interactive Patient Education  2017 [employer].

## 2016-06-09 ENCOUNTER — Encounter: Payer: Self-pay | Admitting: Family Medicine

## 2016-06-09 NOTE — Progress Notes (Signed)
Regina Hogan is a 31 y.o. female who presents to [facility] today for sore throat cough congestion and sinus pain and pressure. Symptoms present for one day and her sister with a hoarse voice. She's had this recurrently previously. She typically gets recurrent episodes of tonsillitis or sinusitis. She notes that seasonal allergies in to be a triggering factor. She takes Zyrtec or sometimes Xyzal as well as Flonase nasal spray regularly. She denies fevers or chills vomiting or diarrhea. She has a current episode is moderate. She works as a Radio producer. She denies any sick contacts at home or at school.   Past Medical History:  Diagnosis Date  . Seasonal allergies 06/08/2016   No past surgical history on file. Social History  Substance Use Topics  . Smoking status: Never Smoker  . Smokeless tobacco: Never Used  . Alcohol use No     Comment: not currently due to pregnancy   family history includes Hyperlipidemia in her mother.  ROS as above:  Medications: Current Outpatient Prescriptions  Medication Sig Dispense Refill  . cetirizine (ZYRTEC) 10 MG tablet Take 10 mg by mouth daily.    . fluticasone (FLONASE) 50 MCG/ACT nasal spray Place into both nostrils daily.    . Lidocaine HCl 2 % SOLN Use as directed 4.5 mg/kg in the mouth or throat every 3 (three) hours as needed (mouth/throat pain - gargle and spit). 100 mL 0  . Magnesium 300 MG CAPS One by mouth daily.  0  . Multiple Vitamins-Minerals (VITAMENT PO) Take by mouth.    . norgestrel-ethinyl estradiol (LO/OVRAL,CRYSELLE) 0.3-30 MG-MCG tablet Take 1 tablet by mouth daily. 1 Package 11  . Probiotic Product (ACIDOPHILUS/GOAT MILK) CAPS Take by mouth.    . Riboflavin (VITAMIN B2 PO) Take by mouth.    . TAYTULLA 1-20 MG-MCG(24) CAPS Take 1 capsule by mouth daily.  11  . cefdinir (OMNICEF) 300 MG capsule Take 1  capsule (300 mg total) by mouth 2 (two) times daily. 14 capsule 0  . montelukast (SINGULAIR) 10 MG tablet Take 1 tablet (10 mg total) by mouth at bedtime. 30 tablet 12   No current facility-administered medications for this visit.    Allergies  Allergen Reactions  . Penicillins Rash    As a child    Health Maintenance Health Maintenance  Topic Date Due  . HIV Screening  11/26/2000  . PAP SMEAR  11/27/2006  . TETANUS/TDAP  09/29/2025  . INFLUENZA VACCINE  Completed     Exam:  BP 121/89   Pulse 97   Temp 98.5 F (36.9 C) (Oral)   Wt 142 lb (64.4 kg)   BMI 24.37 kg/m  Gen: Well NAD HEENT: EOMI,  MMM Tonsils bilaterally are erythematous and enlarged. Clear nasal discharge mildly tender to palpation bilateral maxillary sinuses normal tympanic membranes. Mild cervical lymphadenopathy is present bilaterally. Lungs: Normal work of breathing. CTABL Heart: RRR no MRG Abd: NABS, Soft. Nondistended, Nontender Exts: Brisk capillary refill, warm and well perfused.    No results found for this or any previous visit (from the past 72 hour(s)). No results found.    Assessment and Plan: 31 y.o. female with tonsillitis likely due to either virus or seasonal allergies with second sickening antibacterial etiology. Plan to treat with [facility] as patient can tolerate cephalosporins. Plan to treat additionally with continued Zyrtec Flonase and will add Singulair. If this becomes a recurrent issue recommend referral to ENT for evaluation of  tonsillectomy   No orders of the defined types were placed in this encounter.  Meds ordered this encounter  Medications  . cefdinir (OMNICEF) 300 MG capsule    Sig: Take 1 capsule (300 mg total) by mouth 2 (two) times daily.    Dispense:  14 capsule    Refill:  0  . montelukast (SINGULAIR) 10 MG tablet    Sig: Take 1 tablet (10 mg total) by mouth at bedtime.    Dispense:  30 tablet    Refill:  12  . cetirizine (ZYRTEC) 10 MG tablet    Sig: Take 10  mg by mouth daily.     Discussed warning signs or symptoms. Please see discharge instructions. Patient expresses understanding.

## 2016-06-10 ENCOUNTER — Encounter: Payer: Self-pay | Admitting: Osteopathic Medicine

## 2016-06-10 LAB — HIV-1 RNA BY PCR, QT REFLEX: HIV 1 RNA Qualitative: NEGATIVE

## 2016-06-22 ENCOUNTER — Encounter: Payer: Self-pay | Admitting: Osteopathic Medicine

## 2016-12-14 ENCOUNTER — Other Ambulatory Visit: Payer: Self-pay | Admitting: Osteopathic Medicine

## 2016-12-14 DIAGNOSIS — N926 Irregular menstruation, unspecified: Secondary | ICD-10-CM

## 2017-01-17 ENCOUNTER — Ambulatory Visit (INDEPENDENT_AMBULATORY_CARE_PROVIDER_SITE_OTHER): Payer: BC Managed Care – PPO | Admitting: Osteopathic Medicine

## 2017-01-17 ENCOUNTER — Encounter: Payer: Self-pay | Admitting: Osteopathic Medicine

## 2017-01-17 VITALS — BP 115/74 | HR 81 | Temp 98.4°F | Wt 147.0 lb

## 2017-01-17 DIAGNOSIS — H6981 Other specified disorders of Eustachian tube, right ear: Secondary | ICD-10-CM

## 2017-01-17 DIAGNOSIS — J018 Other acute sinusitis: Secondary | ICD-10-CM | POA: Diagnosis not present

## 2017-01-17 MED ORDER — CEFDINIR 300 MG PO CAPS: 300.0000 mg | ORAL_CAPSULE | Freq: Two times a day (BID) | ORAL | 0 refills | Status: DC

## 2017-01-17 NOTE — Patient Instructions (Signed)
Plan: Antibiotics  Continue other allergy/sinus medications If recurrence of problem, let me know and I'll send referral to specialist

## 2017-01-17 NOTE — Progress Notes (Signed)
HPI: Regina Hogan is a 31 y.o. female who presents to [facility] 01/17/17 for chief complaint of:  Chief Complaint  Patient presents with  . Ear Pain    Acute Illness: . Location/Quality: R ear fullness and R neck discomfort.   Has this issue usually about once per year. Seen by ENT 8-10 years ago, no procedure needed at that time.  . Assoc signs/symptoms: see ROS, (+)temperature last week, subjective fever . Modifying factors: has tried the following OTC/Rx medications: chronic use Flonase, antihistamine and singulair. Swallowing causes discomfort R ear/throat.    Past medical, social and family history reviewed.  Immune compromising conditions or other risk factors: none  Current medications and allergies reviewed.     Review of Systems:  Constitutional: subjective fever/chills  HEENT: No  headache, No  sore throat, Yes  swollen glands  Cardiovascular: No chest pain  Respiratory:No  cough, No  shortness of breath  Gastrointestinal: No  nausea, No  vomiting,  No  diarrhea  Musculoskeletal:   No  myalgia/arthralgia  Skin/Integument:  No  rash   Detailed Exam:  There were no vitals taken for this visit.  Constitutional:   VSS, see above.   General Appearance: alert, well-developed, well-nourished, NAD  Eyes:   Normal lids and conjunctive, non-icteric sclera  Ears, Nose, Mouth, Throat:   Normal external inspection ears/nares  Normal mouth/lips/gums, MMM  normal TM  posterior pharynx without erythema, without exudate. R tonsil slightly larger, no edema/mass  nasal mucosa normal  Skin:  Normal inspection, no rash or concerning lesions noted on limited exam  Neck:   No masses, trachea midline. (+)tendenerss on R but no enlargement of LN lymph nodes  Respiratory:   Normal respiratory effort.   No  wheeze/rhonchi/rales  Cardiovascular:   S1/S2 normal, no murmur/rub/gallop auscultated.  RRR.     ASSESSMENT/PLAN:  Dysfunction of right eustachian tube - conitnue maintenance w/ Flonase, 2nd Gen AH, Singulair. ENT if worse/recurrence. No s/s mass/abscess at this time, consider urgent imaging if worse - Plan: cefdinir (OMNICEF) 300 MG capsule  Other acute sinusitis, recurrence not specified - Plan: cefdinir (OMNICEF) 300 MG capsule     Patient Instructions  Plan: Antibiotics  Continue other allergy/sinus medications If recurrence of problem, let me know and I'll send referral to specialist     Visit summary was printed for the patient with medications and pertinent instructions for patient to review. ER/RTC precautions reviewed. All questions answered. Return if symptoms worsen or fail to improve, and when due for annual check-up .

## 2017-01-19 ENCOUNTER — Ambulatory Visit (INDEPENDENT_AMBULATORY_CARE_PROVIDER_SITE_OTHER): Payer: BC Managed Care – PPO

## 2017-01-19 ENCOUNTER — Encounter: Payer: Self-pay | Admitting: Osteopathic Medicine

## 2017-01-19 ENCOUNTER — Ambulatory Visit (INDEPENDENT_AMBULATORY_CARE_PROVIDER_SITE_OTHER): Payer: BC Managed Care – PPO | Admitting: Osteopathic Medicine

## 2017-01-19 VITALS — BP 119/62 | HR 77 | Temp 98.2°F | Wt 146.0 lb

## 2017-01-19 DIAGNOSIS — M542 Cervicalgia: Secondary | ICD-10-CM | POA: Diagnosis not present

## 2017-01-19 DIAGNOSIS — J039 Acute tonsillitis, unspecified: Secondary | ICD-10-CM

## 2017-01-19 DIAGNOSIS — J018 Other acute sinusitis: Secondary | ICD-10-CM

## 2017-01-19 DIAGNOSIS — R590 Localized enlarged lymph nodes: Secondary | ICD-10-CM | POA: Diagnosis not present

## 2017-01-19 DIAGNOSIS — J36 Peritonsillar abscess: Secondary | ICD-10-CM | POA: Diagnosis not present

## 2017-01-19 MED ORDER — IOPAMIDOL (ISOVUE-300) INJECTION 61%
100.0000 mL | Freq: Once | INTRAVENOUS | Status: AC | PRN
  Administered 2017-01-19: 100 mL via INTRAVENOUS

## 2017-01-19 NOTE — Addendum Note (Signed)
Addended by: Maryla Morrow on: 01/19/2017 10:17 AM   Modules accepted: Orders

## 2017-01-19 NOTE — Progress Notes (Addendum)
HPI: Regina Hogan is a 31 y.o. female who presents to [facility] 01/19/17 for chief complaint of:  Chief Complaint  Patient presents with  . Ear Pain    on abx    Acute Illness: Seen 2 days ago 01/17/17 for R ear pain, started abx Omnicef given allergy to PCN, in addition to maintenance sinus medications.  . Location/Quality: R ear fullness and R neck discomfort. Worse from 2 days ago, now hurts to open her mouth wide   Has this issue usually about once per year. Seen by ENT 8-10 years ago, no procedure needed at that time.  . Assoc signs/symptoms: see ROS, (+)temperature last week, subjective fever but nothing lately . Modifying factors: has tried the following OTC/Rx medications: chronic use Flonase, antihistamine and singulair. Swallowing causes discomfort R ear/throat. Abx as above.   Past medical, social and family history reviewed.  Immune compromising conditions or other risk factors: none  Current medications and allergies reviewed.     Review of Systems:  Constitutional: subjective fever/chills  HEENT: No  headache, No  sore throat, Yes  swollen glands on R feeling  Cardiovascular: No chest pain  Respiratory:No  cough, No  shortness of breath  Gastrointestinal: No  nausea, No  vomiting,  No  diarrhea  Skin/Integument:  No  rash   Detailed Exam:  BP 119/62   Pulse 77   Temp 98.2 F (36.8 C) (Oral)   Wt 146 lb (66.2 kg)   BMI 25.06 kg/m   Constitutional:   VSS, see above.   General Appearance: alert, well-developed, well-nourished, NAD  Eyes:   Normal lids and conjunctive, non-icteric sclera  Ears, Nose, Mouth, Throat:   Normal external inspection ears/nares  Normal mouth/lips/gums, MMM  normal TM bilaterally  posterior pharynx with erythema, without exudate. R tonsil slightly larger - appears consistent with previous exam maybe slightly larger, no obvious edema/mass  nasal mucosa  normal  Skin:  Normal inspection, no rash or concerning lesions noted on limited exam  Neck:   No masses, trachea midline. (+)tendenerss on R but no enlargement of LN lymph nodes  Respiratory:   Normal respiratory effort.   No  wheeze/rhonchi/rales  Cardiovascular:   S1/S2 normal, no murmur/rub/gallop auscultated. RRR.     ASSESSMENT/PLAN: Worsening of symptoms and new trismus, r/o mastoiditis or retropharyngeal abscess or other. CT scan now. Urgent ENT referral to be seen today  Neck pain - Plan: CT SOFT TISSUE NECK W CONTRAST, Ambulatory referral to ENT, CT Maxillofacial W/Cm  Other acute sinusitis, recurrence not specified - Plan: CT Maxillofacial W/Cm  Tonsillitis - Plan: CT Maxillofacial W/Cm   ADDENDUM: 01/19/17 11:57 AM   The primary encounter diagnosis was Tonsillar abscess. Diagnoses of Neck pain, Other acute sinusitis, recurrence not specified, and Tonsillitis were also pertinent to this visit.   Pt has appt w/ ENT this afternoon at 1:00   Ct Soft Tissue Neck W Contrast Ct Maxillofacial W/cm  Result Date: 01/19/2017 CLINICAL DATA:  31 year old female with progressive pain symptoms originally thought related to the right ear. Right ear fullness, right neck discomfort, and new right side trismus. EXAM: CT FACE WITH CONTRAST CT NECK WITH CONTRAST TECHNIQUE: Multidetector CT imaging of the and face and neck was performed using the standard protocol following the bolus administration of intravenous contrast. CONTRAST:  134mL ISOVUE-300 IOPAMIDOL (ISOVUE-300) INJECTION 61% COMPARISON:  Head CT without contrast 03/31/2015. FINDINGS: CT NECK FINDINGS Pharynx and larynx: Negative larynx. There is hypertrophy of the bilateral  lingual tonsil and palatine tonsils. The right palatine tonsil is largest, and heterogeneously enhancing with a central indistinct hypodense area encompassing 12 x 9 x 14 mm (AP by transverse by CC) (series 2, images 31 and series 11, image 24). There  is associated mild inflammatory stranding throughout the right parapharyngeal space. The left parapharyngeal space is normal. There is mild mucosal edema in the nasopharynx primarily on the right. There may be mild retropharyngeal space edema, but there is no discrete retropharyngeal space fluid or collection. Salivary glands: Sublingual space, submandibular glands and parotid glands are within normal limits. No discrete masticator space abnormality on either side. Thyroid: Negative. Lymph nodes: Reactive appearing right greater than left level 2 lymph nodes, enlarged, lobulated, and up to 15 mm short axis on the right (series 10, image 36 and series 11, image 15). No cystic or necrotic nodes. The right level 1 and level 3 nodal stations are less affected. Vascular: Major vascular structures in the neck and at the skullbase are patent including the right IJ. Limited intracranial: Negative. Orbits: See face findings below. Mastoids and visualized paranasal sinuses: See face findings below. Skeleton: No carious dentition identified. Mandible intact. Negative CT appearance of the cervical spine. No acute osseous abnormality identified. Upper chest: Negative lung apices and visualized superior mediastinum. CT FACE FINDINGS Visible Skull: Negative.  No acute osseous abnormality identified. Sinuses and Mastoids: The bilateral tympanic cavities and mastoids are clear. The paranasal sinuses are clear aside from a rounded 2.4 cm probable right maxillary mucous retention cyst. Orbits: Visualized orbits and scalp soft tissues are within normal limits. Other: Right palatine tonsillar and parapharyngeal space abnormality as stated above. IMPRESSION: 1. Positive for Acute Tonsillitis with developing right side Intra-tonsillar Abscess (12-14 mm diameter). Associated inflammation in the right parapharyngeal space. Possible trace retropharyngeal effusion, but no other abscess or complicating features. 2. Reactive right greater than  left cervical lymphadenopathy. No suppurated lymph nodes. 3. Middle ears and mastoids appear normal. No osseous abnormality identified. Electronically Signed   By: Genevie Ann M.D.   On: 01/19/2017 11:05   Will send report to ENT    ADDENDUM 01/20/17 7:54 AM  Records reviewed from ENT, right-sided peritonsillar abscess drainage performed in the office, changing to clindamycin 300 mg 3 times daily times 7 days, schedule tonsillectomy after 2 weeks   Visit summary was printed for the patient with medications and pertinent instructions for patient to review. ER/RTC precautions reviewed. All questions answered. Return for recheck depending on ENT recommendations .

## 2017-01-20 DIAGNOSIS — J36 Peritonsillar abscess: Secondary | ICD-10-CM | POA: Insufficient documentation

## 2017-03-31 ENCOUNTER — Other Ambulatory Visit: Payer: Self-pay

## 2017-03-31 DIAGNOSIS — N926 Irregular menstruation, unspecified: Secondary | ICD-10-CM

## 2017-03-31 MED ORDER — NORGESTREL-ETHINYL ESTRADIOL 0.3-30 MG-MCG PO TABS: 1.0000 | ORAL_TABLET | Freq: Every day | ORAL | 0 refills | Status: DC

## 2017-04-27 ENCOUNTER — Other Ambulatory Visit: Payer: Self-pay | Admitting: Osteopathic Medicine

## 2017-04-27 ENCOUNTER — Ambulatory Visit (INDEPENDENT_AMBULATORY_CARE_PROVIDER_SITE_OTHER): Payer: BC Managed Care – PPO | Admitting: Osteopathic Medicine

## 2017-04-27 ENCOUNTER — Encounter: Payer: Self-pay | Admitting: Osteopathic Medicine

## 2017-04-27 VITALS — BP 113/63 | HR 59 | Temp 98.4°F | Wt 152.0 lb

## 2017-04-27 DIAGNOSIS — N926 Irregular menstruation, unspecified: Secondary | ICD-10-CM

## 2017-04-27 DIAGNOSIS — Z0000 Encounter for general adult medical examination without abnormal findings: Secondary | ICD-10-CM | POA: Diagnosis not present

## 2017-04-27 DIAGNOSIS — Z124 Encounter for screening for malignant neoplasm of cervix: Secondary | ICD-10-CM

## 2017-04-27 MED ORDER — NORGESTREL-ETHINYL ESTRADIOL 0.3-30 MG-MCG PO TABS: 1.0000 | ORAL_TABLET | Freq: Every day | ORAL | 11 refills | Status: DC

## 2017-04-27 NOTE — Progress Notes (Signed)
HPI: Regina Hogan is a 32 y.o. female who  has a past medical history of Seasonal allergies (06/08/2016).  she presents to [facility] today, 04/27/17,  for chief complaint of: Annual Checkup    Patient here for annual physical / wellness exam.  See preventive care reviewed as below.  Recent labs reviewed in detail with the patient.   Additional concerns today include:  None  Needs refill on OCP    Past medical, surgical, social and family history reviewed:  Patient Active Problem List   Diagnosis Date Noted  . Tonsillar abscess 01/20/2017  . Tonsillitis 06/08/2016  . Seasonal allergies 06/08/2016  . Eustachian tube dysfunction 01/30/2014    No past surgical history on file.  Social History   Tobacco Use  . Smoking status: Never Smoker  . Smokeless tobacco: Never Used  Substance Use Topics  . Alcohol use: No    Alcohol/week: 0.0 oz    Comment: not currently due to pregnancy    Family History  Problem Relation Age of Onset  . Cancer - Other Unknown        grandfather   . Depression Unknown        grandmother   . Hyperlipidemia Mother      Current medication list and allergy/intolerance information reviewed:    Current Outpatient Medications  Medication Sig Dispense Refill  . cetirizine (ZYRTEC) 10 MG tablet Take 10 mg by mouth daily.    . fluticasone (FLONASE) 50 MCG/ACT nasal spray Place into both nostrils daily.    . Magnesium 300 MG CAPS One by mouth daily.  0  . montelukast (SINGULAIR) 10 MG tablet Take 1 tablet (10 mg total) by mouth at bedtime. 30 tablet 12  . Multiple Vitamins-Minerals (VITAMENT PO) Take by mouth.    . Probiotic Product (ACIDOPHILUS/GOAT MILK) CAPS Take by mouth.    . Riboflavin (VITAMIN B2 PO) Take by mouth.    . TAYTULLA 1-20 MG-MCG(24) CAPS Take 1 capsule by mouth daily.  11  . cefdinir (OMNICEF) 300 MG capsule Take 1 capsule (300 mg total) by mouth 2 (two) times daily. (Patient not  taking: Reported on 04/27/2017) 14 capsule 0  . norgestrel-ethinyl estradiol (CRYSELLE-28) 0.3-30 MG-MCG tablet Take 1 tablet by mouth daily. Pt needs f/u appt w/PCP for further refills (Patient not taking: Reported on 04/27/2017) 28 tablet 0   No current facility-administered medications for this visit.     Allergies  Allergen Reactions  . Penicillins Rash    As a child      Review of Systems:  Constitutional:  No  fever, no chills, No recent illness, No unintentional weight changes. No significant fatigue.   HEENT: No  headache, no vision change  Cardiac: No  chest pain, No  pressure, No palpitations,   Respiratory:  No  shortness of breath. No  Cough  Gastrointestinal: No  abdominal pain, No  nausea, No  vomiting,  No  blood in stool, No  diarrhea, No  constipation   Musculoskeletal: No new myalgia/arthralgia  Skin: No  Rash  Genitourinary: No  incontinence, No  abnormal genital bleeding, No abnormal genital discharge  Neurologic: No  weakness, No  dizziness  Psychiatric: No  concerns with depression, No  concerns with anxiety  Exam:  BP 113/63   Pulse (!) 59   Temp 98.4 F (36.9 C) (Oral)   Wt 152 lb 0.6 oz (69 kg)   LMP 04/07/2017   BMI 26.10 kg/m  Constitutional: VS see above. General Appearance: alert, well-developed, well-nourished, NAD  Eyes: Normal lids and conjunctive, non-icteric sclera  Ears, Nose, Mouth, Throat: MMM, Normal external inspection ears/nares/mouth/lips/gums.   Neck: No masses, trachea midline. No thyroid enlargement. No tenderness/mass appreciated. No lymphadenopathy  Respiratory: Normal respiratory effort. no wheeze, no rhonchi, no rales  Cardiovascular: S1/S2 normal, no murmur, no rub/gallop auscultated. RRR. No lower extremity edema.   Gastrointestinal: Nontender, no masses. No hepatomegaly, no splenomegaly. No hernia appreciated. Bowel sounds normal. Rectal exam deferred.   Musculoskeletal: Gait normal. No clubbing/cyanosis of  digits.   Neurological: Normal balance/coordination. No tremor. .   Skin: warm, dry, intact. No rash/ulcer. No concerning nevi or subq nodules on limited exam.    Psychiatric: Normal judgment/insight. Normal mood and affect. Oriented x3.  GYN: No lesions/ulcers to external genitalia, normal urethra, normal vaginal mucosa, physiologic discharge, cervix friable c/w OCP use, cervix without lesions, uterus not enlarged or tender, adnexa no masses and nontender  BREAST: No rashes/skin changes, normal fibrocystic breast tissue, no masses or tenderness, normal nipple without discharge, normal axilla     ASSESSMENT/PLAN:   Annual physical exam - Plan: CBC, COMPLETE METABOLIC PANEL WITH GFR, Lipid panel, VITAMIN D 25 Hydroxy (Vit-D Deficiency, Fractures)  Cervical cancer screening - Plan: Cytology - PAP  Irregular bleeding - Plan: norgestrel-ethinyl estradiol (CRYSELLE-28) 0.3-30 MG-MCG tablet    FEMALE PREVENTIVE CARE Updated 04/27/17   ANNUAL SCREENING/COUNSELING  Diet/Exercise - HEALTHY HABITS DISCUSSED TO DECREASE CV RISK Social History   Tobacco Use  Smoking Status Never Smoker  Smokeless Tobacco Never Used    Social History   Substance and Sexual Activity  Alcohol Use No  . Alcohol/week: 0.0 oz   Comment: not currently due to pregnancy     No flowsheet data found.  Domestic violence concerns - no  HTN SCREENING - SEE [place]  Sexually active in the past year - Yes with female.  Need/want STI testing today? - no  Concerns about libido or pain with sex? - no  Plans for pregnancy? - none - on OCP  INFECTIOUS DISEASE SCREENING  HIV - does not need  GC/CT - does not need  HepC - DOB [birth year] - does not need  TB - does not need  DISEASE SCREENING  Lipid - needs  DM2 - needs  Osteoporosis - women age 44+ - does not need  CANCER SCREENING  Cervical - needs  Breast - does not need  Lung - does not need  Colon - does not  need  ADULT VACCINATION  Influenza - annual vaccine recommended  Td - booster every 10 years   Zoster - Shingrix recommended 50+  PCV13 - was not indicated  PPSV23 - was not indicated Immunization History  Administered Date(s) Administered  . Influenza-Unspecified 01/04/2016  . PPD Test 09/30/2015  . Tdap 09/30/2015    OTHER  Fall - exercise and Vit D age 63+ - does not need      Visit summary with medication list and pertinent instructions was printed for patient to review. All questions at time of visit were answered - patient instructed to contact office with any additional concerns. ER/RTC precautions were reviewed with the patient.   Follow-up plan: Return in about 1 year (around 04/27/2018) for [place] - sooner if needed .    Please note: voice recognition software was used to produce this document, and typos may escape review. Please contact Dr. Sheppard Coil for any needed clarifications.

## 2017-05-03 LAB — PAP, TP IMAGING W/ HPV RNA, RFLX HPV TYPE 16,18/45: HPV DNA High Risk: NOT DETECTED

## 2017-06-25 ENCOUNTER — Other Ambulatory Visit: Payer: Self-pay | Admitting: Family Medicine

## 2018-03-29 ENCOUNTER — Other Ambulatory Visit: Payer: Self-pay | Admitting: Osteopathic Medicine

## 2018-03-29 DIAGNOSIS — N926 Irregular menstruation, unspecified: Secondary | ICD-10-CM

## 2018-03-30 NOTE — Telephone Encounter (Signed)
Attempted to contact patient to inform her that she will need to schedule an appointment for refills of this medication. Her VM is not set up.Maryruth Eve, [place] Crocker, CMA

## 2018-04-05 ENCOUNTER — Other Ambulatory Visit: Payer: Self-pay | Admitting: Osteopathic Medicine

## 2018-04-05 DIAGNOSIS — N926 Irregular menstruation, unspecified: Secondary | ICD-10-CM

## 2018-06-04 ENCOUNTER — Other Ambulatory Visit: Payer: Self-pay | Admitting: Osteopathic Medicine

## 2018-06-11 ENCOUNTER — Other Ambulatory Visit: Payer: Self-pay | Admitting: Osteopathic Medicine

## 2018-06-11 DIAGNOSIS — N926 Irregular menstruation, unspecified: Secondary | ICD-10-CM

## 2018-06-12 ENCOUNTER — Other Ambulatory Visit: Payer: Self-pay | Admitting: Osteopathic Medicine

## 2018-06-12 DIAGNOSIS — N926 Irregular menstruation, unspecified: Secondary | ICD-10-CM

## 2018-06-13 MED ORDER — NORGESTREL-ETHINYL ESTRADIOL 0.3-30 MG-MCG PO TABS: 1.0000 | ORAL_TABLET | Freq: Every day | ORAL | 0 refills | Status: DC

## 2018-06-28 ENCOUNTER — Other Ambulatory Visit: Payer: Self-pay | Admitting: Osteopathic Medicine

## 2018-06-28 DIAGNOSIS — N926 Irregular menstruation, unspecified: Secondary | ICD-10-CM

## 2018-07-17 ENCOUNTER — Encounter: Payer: Self-pay | Admitting: Osteopathic Medicine

## 2018-07-18 ENCOUNTER — Other Ambulatory Visit: Payer: Self-pay | Admitting: Osteopathic Medicine

## 2018-07-18 DIAGNOSIS — N926 Irregular menstruation, unspecified: Secondary | ICD-10-CM

## 2018-07-18 MED ORDER — NORGESTREL-ETHINYL ESTRADIOL 0.3-30 MG-MCG PO TABS: 1.0000 | ORAL_TABLET | Freq: Every day | ORAL | 0 refills | Status: DC

## 2018-08-11 ENCOUNTER — Other Ambulatory Visit: Payer: Self-pay | Admitting: Osteopathic Medicine

## 2018-08-11 DIAGNOSIS — N926 Irregular menstruation, unspecified: Secondary | ICD-10-CM

## 2018-08-15 ENCOUNTER — Other Ambulatory Visit: Payer: Self-pay | Admitting: Osteopathic Medicine

## 2018-08-15 ENCOUNTER — Telehealth: Payer: Self-pay | Admitting: Osteopathic Medicine

## 2018-08-15 ENCOUNTER — Other Ambulatory Visit: Payer: Self-pay

## 2018-08-15 DIAGNOSIS — N926 Irregular menstruation, unspecified: Secondary | ICD-10-CM

## 2018-08-15 MED ORDER — NORGESTREL-ETHINYL ESTRADIOL 0.3-30 MG-MCG PO TABS: 1.0000 | ORAL_TABLET | Freq: Every day | ORAL | 0 refills | Status: DC

## 2018-08-15 NOTE — Telephone Encounter (Signed)
Rx refill has been sent to pt's local pharmacy with no refills. Thanks for the update.

## 2018-08-15 NOTE — Telephone Encounter (Signed)
Pt scheduled her CPE With Dr.Alexander for the first time available and her appt is 6/11 and she will be out of her birth control before then so can someone let her know if she can have enough called in to get her by until her next appt time

## 2018-08-31 ENCOUNTER — Other Ambulatory Visit: Payer: Self-pay | Admitting: Osteopathic Medicine

## 2018-08-31 ENCOUNTER — Ambulatory Visit (INDEPENDENT_AMBULATORY_CARE_PROVIDER_SITE_OTHER): Payer: BC Managed Care – PPO | Admitting: Osteopathic Medicine

## 2018-08-31 ENCOUNTER — Encounter: Payer: Self-pay | Admitting: Osteopathic Medicine

## 2018-08-31 VITALS — BP 111/76 | HR 76 | Wt 149.0 lb

## 2018-08-31 DIAGNOSIS — Z0000 Encounter for general adult medical examination without abnormal findings: Secondary | ICD-10-CM | POA: Diagnosis not present

## 2018-08-31 NOTE — Patient Instructions (Signed)
General Preventive Care  Most recent routine screening lipids/other labs: ordered today.   Cholesterol and Diabetes screening usually recommended annually.   Thyroid and Vitamin D: routine screening is not medically necessary, therefore most insurance will not cover this test as part of "free labs" on your annual physical. If you desire this testing, you may be charged for it! Please check with the lab before your blood draw if you're concerned about cost.   Everyone should have blood pressure checked once per year.   Tobacco: don't!  Alcohol: responsible moderation is ok for most adults - if you have concerns about your alcohol intake, please talk to me!   Exercise: as tolerated to reduce risk of cardiovascular disease and diabetes. Strength training will also prevent osteoporosis.   Mental health: if need for mental health care (medicines, counseling, other), or concerns about moods, please let me know!   Sexual health: if need for STD testing, or if concerns with libido/pain problems, please let me know! If you need to discuss your birth control options, please let me know!    Advanced Directive: Living Will and/or Healthcare Power of Attorney recommended for all adults, regardless of age or health.  Vaccines  Flu vaccine: recommended for almost everyone, every fall.   Shingles vaccine: Shingrix recommended after age 68.   Pneumonia vaccines: recommended after age 67  Tetanus booster: Tdap recommended every 10 years. Due 09/2025 Cancer screenings   Colon cancer screening: recommended for everyone at age 26  Breast cancer screening: mammogram recommended at age 64  Cervical cancer screening: Pap every 1 to 5 years depending on age and other risk factors. Due 04/2022  Lung cancer screening: not needed for non-smokers  Infection screenings . HIV: recommended screening at least once age 47-65, more often as needed. . Gonorrhea/Chlamydia: screening as needed. . Hepatitis C:  recommended for anyone born 11-1963 . TB: certain at-risk populations, or depending on work requirements and/or travel history Other . Bone Density Test: recommended for women at age 77

## 2018-08-31 NOTE — Progress Notes (Signed)
HPI: Regina Hogan is a 33 y.o. female who  has a past medical history of Seasonal allergies (06/08/2016).  she presents to [facility] today, 08/31/18,  for chief complaint of: Annual Checkup    Patient here for annual physical / wellness exam.  See preventive care reviewed as below.    Additional concerns today include:  None     Past medical, surgical, social and family history reviewed:  Patient Active Problem List   Diagnosis Date Noted  . Tonsillar abscess 01/20/2017  . Tonsillitis 06/08/2016  . Seasonal allergies 06/08/2016  . Eustachian tube dysfunction 01/30/2014    Past Surgical History:  Procedure Laterality Date  . TONSILLECTOMY      Social History   Tobacco Use  . Smoking status: Never Smoker  . Smokeless tobacco: Never Used  Substance Use Topics  . Alcohol use: No    Alcohol/week: 0.0 standard drinks    Comment: 4 dirnks per week or so    Family History  Problem Relation Age of Onset  . Cancer - Other Unknown        grandfather   . Depression Unknown        grandmother   . Hyperlipidemia Mother   . Fainting Father   . Arrhythmia Father      Current medication list and allergy/intolerance information reviewed:    Current Outpatient Medications  Medication Sig Dispense Refill  . cetirizine (ZYRTEC) 10 MG tablet Take 10 mg by mouth daily.    . fluticasone (FLONASE) 50 MCG/ACT nasal spray Place into both nostrils daily.    . Magnesium 300 MG CAPS One by mouth daily.  0  . montelukast (SINGULAIR) 10 MG tablet TAKE 1 TABLET BY MOUTH EVERYDAY AT BEDTIME 90 tablet 3  . Multiple Vitamins-Minerals (VITAMENT PO) Take by mouth.    . Probiotic Product (ACIDOPHILUS/GOAT MILK) CAPS Take by mouth.    . Riboflavin (VITAMIN B2 PO) Take by mouth.     No current facility-administered medications for this visit.     Allergies  Allergen Reactions  . Penicillins Rash    As a child      Review of  Systems:  Constitutional:  No  fever, no chills, No recent illness, No unintentional weight changes. No significant fatigue.   HEENT: No  headache, no vision change  Cardiac: No  chest pain, No  pressure, No palpitations,   Respiratory:  No  shortness of breath. No  Cough  Gastrointestinal: No  abdominal pain, No  nausea, No  vomiting,  No  blood in stool, No  diarrhea, No  constipation   Musculoskeletal: No new myalgia/arthralgia  Skin: No  Rash  Genitourinary: No  incontinence, No  abnormal genital bleeding, No abnormal genital discharge  Neurologic: No  weakness, No  dizziness  Psychiatric: No  concerns with depression, No  concerns with anxiety  Exam:  BP 111/76   Pulse 76   Wt 149 lb (67.6 kg)   SpO2 98%   BMI 25.58 kg/m   Constitutional: VS see above. General Appearance: alert, well-developed, well-nourished, NAD  Eyes: Normal lids and conjunctive, non-icteric sclera  Ears, Nose, Mouth, Throat: MMM, Normal external inspection ears/nares/mouth/lips/gums.   Neck: No masses, trachea midline. No thyroid enlargement. No tenderness/mass appreciated. No lymphadenopathy  Respiratory: Normal respiratory effort. no wheeze, no rhonchi, no rales  Cardiovascular: S1/S2 normal, no murmur, no rub/gallop auscultated. RRR. No lower extremity edema.   Gastrointestinal: Nontender, no masses. No hepatomegaly, no splenomegaly. No  hernia appreciated.   Musculoskeletal: Gait normal. No clubbing/cyanosis of digits.   Neurological: Normal balance/coordination. No tremor. .   Skin: warm, dry, intact. No rash/ulcer.  Psychiatric: Normal judgment/insight. Normal mood and affect. Oriented x3.       ASSESSMENT/PLAN:   Annual physical exam - Plan: CBC, COMPLETE METABOLIC PANEL WITH GFR, Lipid panel, TSH, VITAMIN D 25 Hydroxy (Vit-D Deficiency, Fractures),     FEMALE PREVENTIVE CARE Updated 08/31/18   ANNUAL SCREENING/COUNSELING  Diet/Exercise - HEALTHY HABITS DISCUSSED TO  DECREASE CV RISK Social History   Tobacco Use  Smoking Status Never Smoker  Smokeless Tobacco Never Used   Social History   Substance and Sexual Activity  Alcohol Use No  . Alcohol/week: 0.0 standard drinks   Comment: 4 dirnks per week or so   Depression screen [facility] 2/9 08/31/2018  Decreased Interest 0  Down, Depressed, Hopeless 0  PHQ - 2 Score 0  Altered sleeping 1  Tired, decreased energy 0  Change in appetite 0  Feeling bad or failure about yourself  0  Trouble concentrating 0  Moving slowly or fidgety/restless 0  Suicidal thoughts 0  PHQ-9 Score 1  Difficult doing work/chores Somewhat difficult    Domestic violence concerns - no  HTN SCREENING - SEE [place]  Sexually active in the past year - Yes with female.  Need/want STI testing today? - no  Concerns about libido or pain with sex? - no  Plans for pregnancy? - possibly, has stopped OCP  INFECTIOUS DISEASE SCREENING  HIV - does not need  GC/CT - does not need  HepC - DOB [birth year] - does not need  TB - does not need  DISEASE SCREENING  Lipid - needs  DM2 - needs  Osteoporosis - women age 80+ - does not need  CANCER SCREENING  Cervical - needs  Breast - does not need  Lung - does not need  Colon - does not need  ADULT VACCINATION  Influenza - annual vaccine recommended  Td - booster every 10 years   Zoster - Shingrix recommended 50+  PCV13 - was not indicated  PPSV23 - was not indicated Immunization History  Administered Date(s) Administered  . Influenza Split 12/15/2012  . Influenza, Quadrivalent, Recombinant, Inj, Pf 12/02/2013  . Influenza, Seasonal, Injecte, Preservative Fre 12/02/2013  . Influenza,inj,quad, With Preservative 01/23/2015  . Influenza-Unspecified 01/04/2016, 12/20/2016  . PPD Test 09/30/2015  . Tdap 09/30/2015    OTHER  Fall - exercise and Vit D age 17+ - does not need   Patient Instructions  General Preventive Care  Most recent  routine screening lipids/other labs: ordered today.   Cholesterol and Diabetes screening usually recommended annually.   Thyroid and Vitamin D: routine screening is not medically necessary, therefore most insurance will not cover this test as part of "free labs" on your annual physical. If you desire this testing, you may be charged for it! Please check with the lab before your blood draw if you're concerned about cost.   Everyone should have blood pressure checked once per year.   Tobacco: don't!  Alcohol: responsible moderation is ok for most adults - if you have concerns about your alcohol intake, please talk to me!   Exercise: as tolerated to reduce risk of cardiovascular disease and diabetes. Strength training will also prevent osteoporosis.   Mental health: if need for mental health care (medicines, counseling, other), or concerns about moods, please let me know!   Sexual health: if need for  STD testing, or if concerns with libido/pain problems, please let me know! If you need to discuss your birth control options, please let me know!    Advanced Directive: Living Will and/or Healthcare Power of Attorney recommended for all adults, regardless of age or health.  Vaccines  Flu vaccine: recommended for almost everyone, every fall.   Shingles vaccine: Shingrix recommended after age 51.   Pneumonia vaccines: recommended after age 59  Tetanus booster: Tdap recommended every 10 years. Due 09/2025 Cancer screenings   Colon cancer screening: recommended for everyone at age 22  Breast cancer screening: mammogram recommended at age 5  Cervical cancer screening: Pap every 1 to 5 years depending on age and other risk factors. Due 04/2022  Lung cancer screening: not needed for non-smokers  Infection screenings . HIV: recommended screening at least once age 2-65, more often as needed. . Gonorrhea/Chlamydia: screening as needed. . Hepatitis C: recommended for anyone born  [birth year]-1965 . TB: certain at-risk populations, or depending on work requirements and/or travel history Other . Bone Density Test: recommended for women at age 67     Visit summary with medication list and pertinent instructions was printed for patient to review. All questions at time of visit were answered - patient instructed to contact office with any additional concerns. ER/RTC precautions were reviewed with the patient.   Follow-up plan: Return in about 1 year (around 08/31/2019) for annual physical, sooner if needed! .    Please note: voice recognition software was used to produce this document, and typos may escape review. Please contact Dr. Sheppard Coil for any needed clarifications.

## 2018-08-31 NOTE — Telephone Encounter (Signed)
Forwarding to PCP for review. Patent has appt 6/11 w/PCP.

## 2018-09-01 LAB — COMPLETE METABOLIC PANEL WITH GFR
AG Ratio: 1.8 (calc) (ref 1.0–2.5)
ALT: 24 U/L (ref 6–29)
AST: 32 U/L — ABNORMAL HIGH (ref 10–30)
Albumin: 4.2 g/dL (ref 3.6–5.1)
Alkaline phosphatase (APISO): 35 U/L (ref 31–125)
BUN: 22 mg/dL (ref 7–25)
CO2: 24 mmol/L (ref 20–32)
Calcium: 9 mg/dL (ref 8.6–10.2)
Chloride: 103 mmol/L (ref 98–110)
Creat: 0.87 mg/dL (ref 0.50–1.10)
GFR, Est African American: 102 mL/min/{1.73_m2} (ref >=60)
GFR, Est Non African American: 88 mL/min/{1.73_m2} (ref >=60)
Globulin: 2.4 g/dL (calc) (ref 1.9–3.7)
Glucose, Bld: 75 mg/dL (ref 65–99)
Potassium: 4.2 mmol/L (ref 3.5–5.3)
Sodium: 138 mmol/L (ref 135–146)
Total Bilirubin: 0.9 mg/dL (ref 0.2–1.2)
Total Protein: 6.6 g/dL (ref 6.1–8.1)

## 2018-09-01 LAB — TSH: TSH: 2.02 mIU/L

## 2018-09-01 LAB — CBC
HCT: 41.6 % (ref 35.0–45.0)
Hemoglobin: 13.7 g/dL (ref 11.7–15.5)
MCH: 30.3 pg (ref 27.0–33.0)
MCHC: 32.9 g/dL (ref 32.0–36.0)
MCV: 92 fL (ref 80.0–100.0)
MPV: 10.3 fL (ref 7.5–12.5)
Platelets: 269 10*3/uL (ref 140–400)
RBC: 4.52 10*6/uL (ref 3.80–5.10)
RDW: 12.7 % (ref 11.0–15.0)
WBC: 6.4 10*3/uL (ref 3.8–10.8)

## 2018-09-01 LAB — VITAMIN D 25 HYDROXY (VIT D DEFICIENCY, FRACTURES): Vit D, 25-Hydroxy: 50 ng/mL (ref 30–100)

## 2018-09-01 LAB — LIPID PANEL
Cholesterol: 200 mg/dL — ABNORMAL HIGH (ref <200)
HDL: 92 mg/dL (ref >=50)
LDL Cholesterol (Calc): 93 mg/dL (calc)
Non-HDL Cholesterol (Calc): 108 mg/dL (calc) (ref <130)
Total CHOL/HDL Ratio: 2.2 (calc) (ref <5.0)
Triglycerides: 68 mg/dL (ref <150)

## 2018-09-11 ENCOUNTER — Other Ambulatory Visit: Payer: Self-pay | Admitting: Osteopathic Medicine

## 2018-09-11 DIAGNOSIS — N926 Irregular menstruation, unspecified: Secondary | ICD-10-CM

## 2018-09-11 NOTE — Telephone Encounter (Signed)
[facility] pharmacy requesting med refill for cryselle birth control. Rx not listed in active med list. Pls advise, thanks.

## 2018-09-11 NOTE — Telephone Encounter (Signed)
Please review

## 2019-04-18 IMAGING — CT CT MAXILLOFACIAL W/ CM
3 of 9 series · 11 of 47 positions shown, 13 images · IV contrast (iopamidol)
Comparison: Head CT without contrast 03/31/2015.

CLINICAL DATA: 31-year-old female with progressive pain symptoms
originally thought related to the right ear. Right ear fullness,
right neck discomfort, and new right side trismus.

EXAM:
CT FACE WITH CONTRAST
CT NECK WITH CONTRAST
TECHNIQUE: Multidetector CT imaging of the and face and neck was performed
using the standard protocol following the bolus administration of
intravenous contrast.
CONTRAST:  100mL [id number] IOPAMIDOL ([id number]) INJECTION 61%

[Series 7: sagittal soft · sagittal · 0.30mm/px · 1 of 84 slices shown]
[im 42/84  bone]
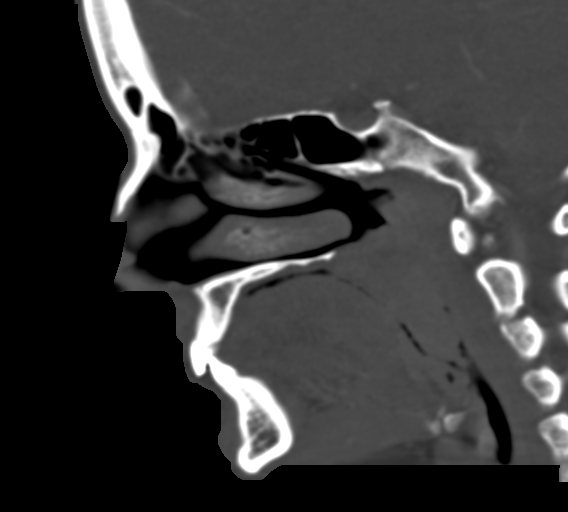

[Series 12: cor neck · coronal · 0.31mm/px · 3 of 85 slices shown]
[im 22/85  bone]
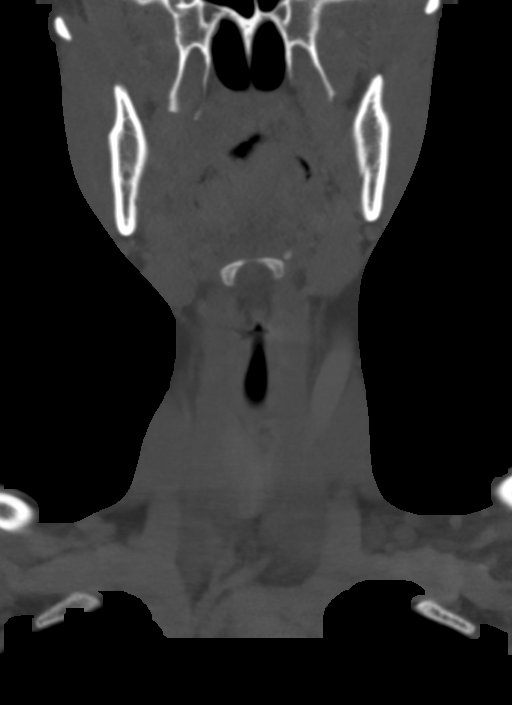
[im 43/85  bone]
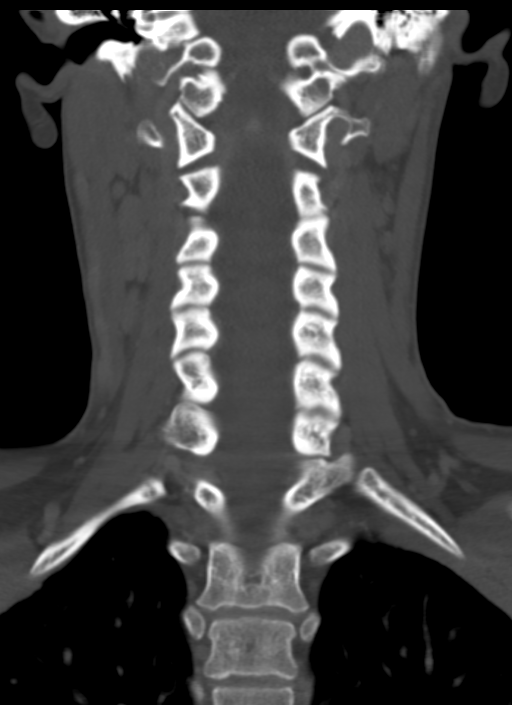
[im 64/85  bone]
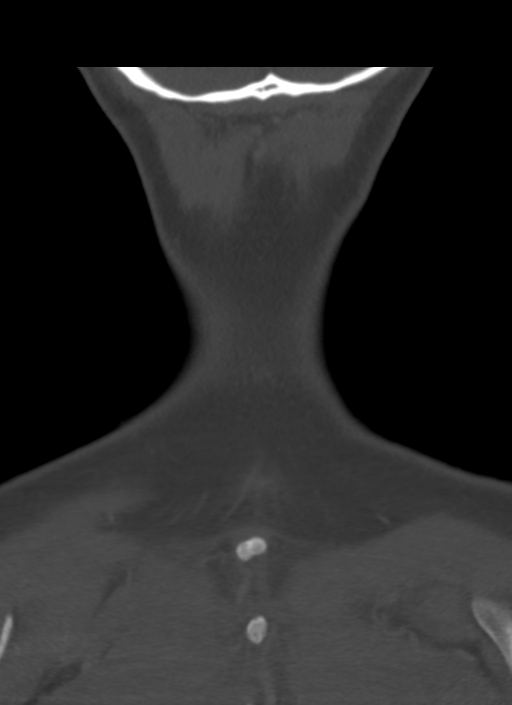

[Series 14: orthogonal ax · axial · 0.35mm/px · z∈[-334,-166]mm · 7 of 116 slices shown, 9 images]
[im 12/116  brain]
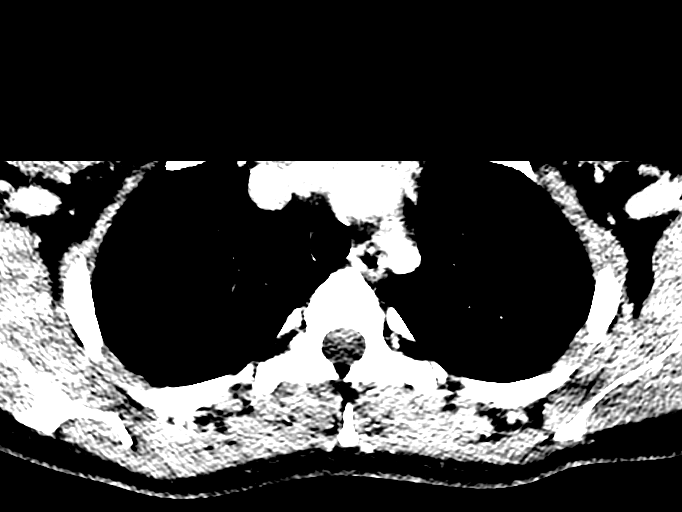
[im 12/116  bone]
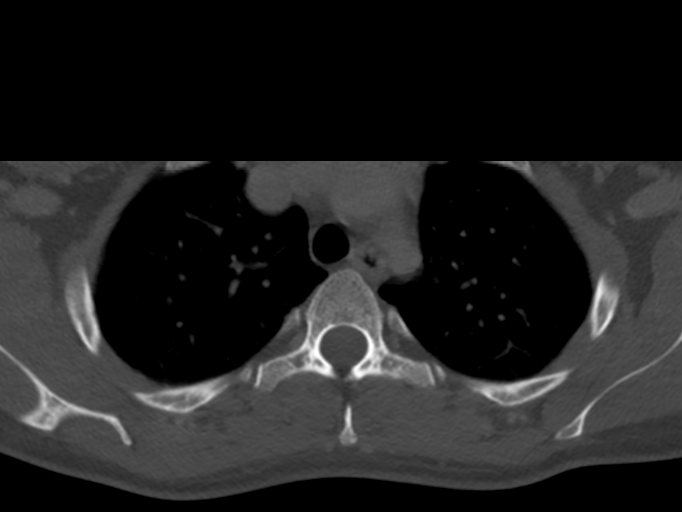
[im 24/116  bone]
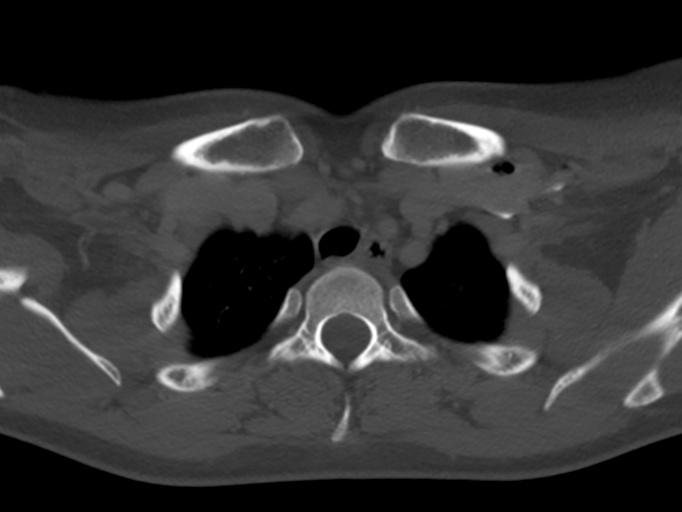
[im 47/116  bone]
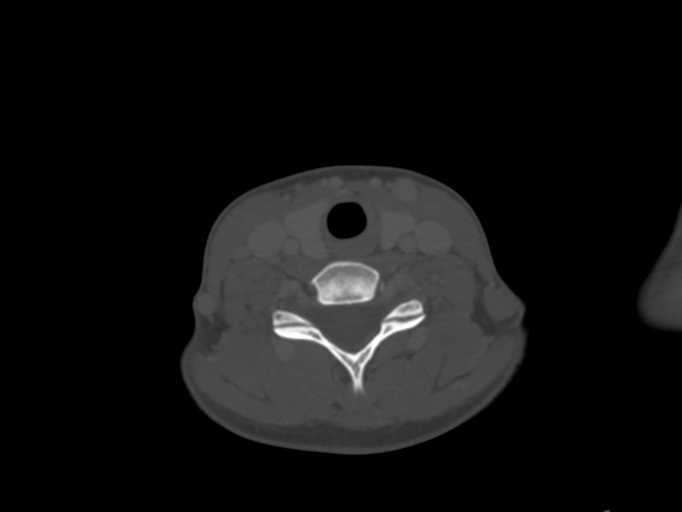
[im 58/116  bone]
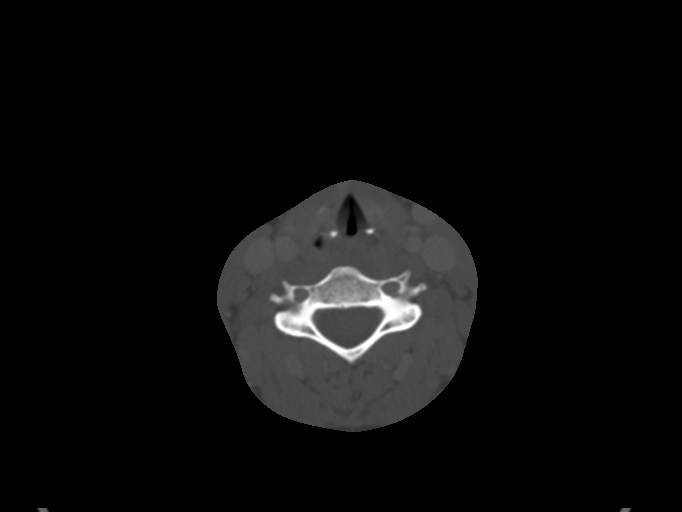
[im 70/116  brain]
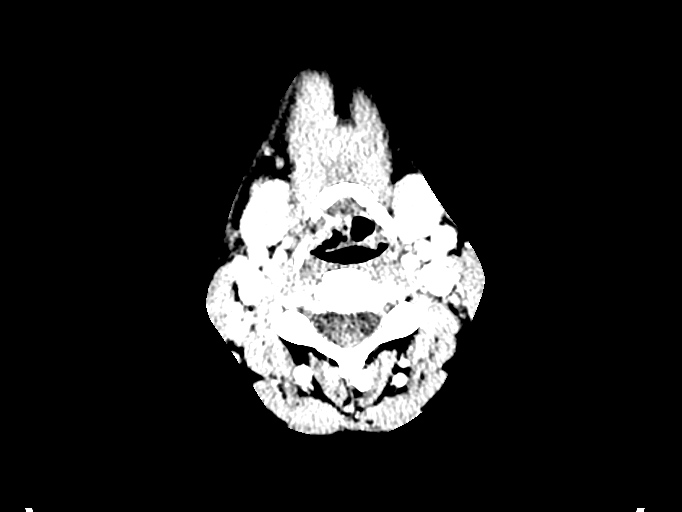
[im 70/116  bone]
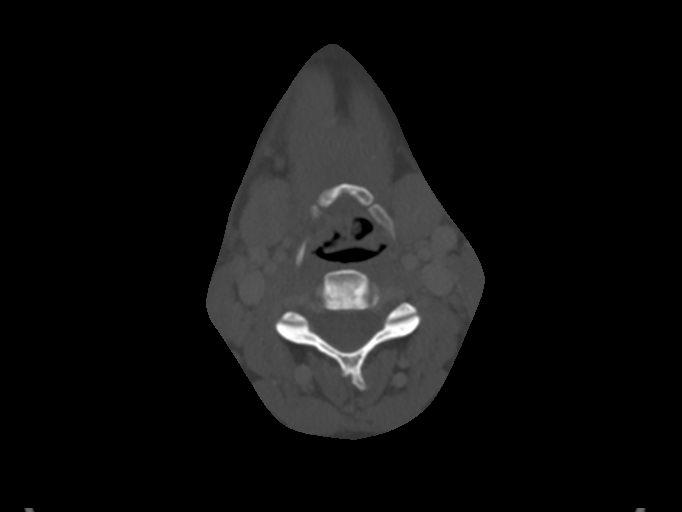
[im 93/116  bone]
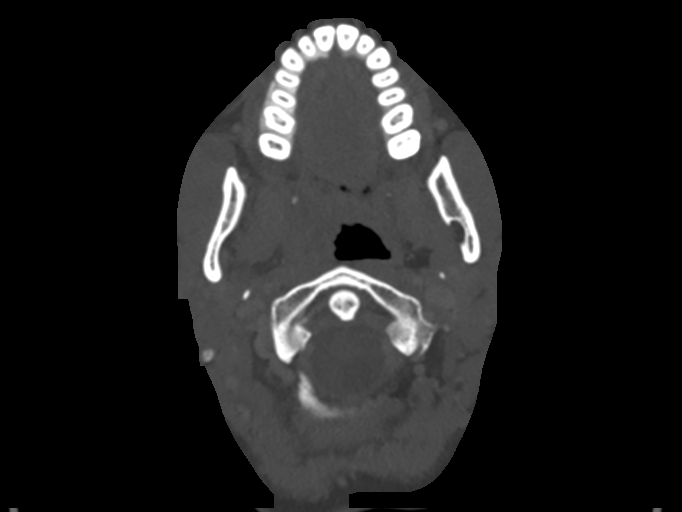
[im 104/116  bone]
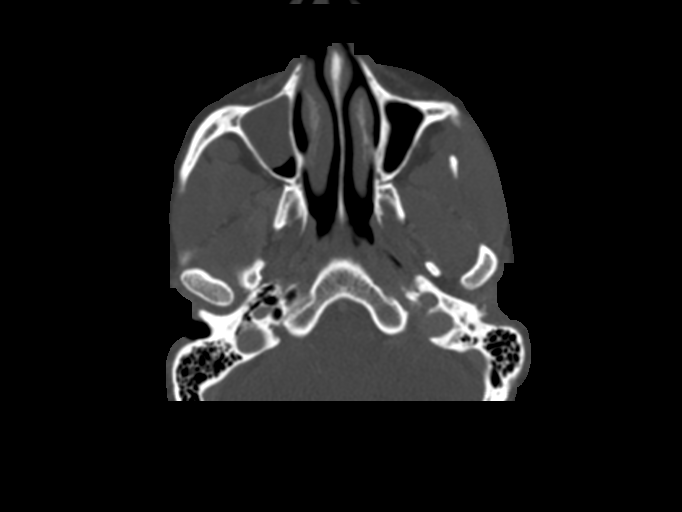

[11 of 47 positions shown; findings below may reference images not displayed]

FINDINGS: CT NECK FINDINGS

Pharynx and larynx: Negative larynx.

There is hypertrophy of the bilateral lingual tonsil and palatine
tonsils. The right palatine tonsil is largest, and heterogeneously
enhancing with a central indistinct hypodense area encompassing 12 x
9 x 14 mm (AP by transverse by CC) (series 2, images 31 and series
11, image 24). There is associated mild inflammatory stranding
throughout the right parapharyngeal space. The left parapharyngeal
space is normal. There is mild mucosal edema in the nasopharynx
primarily on the right. There may be mild retropharyngeal space
edema, but there is no discrete retropharyngeal space fluid or
collection.

Salivary glands: Sublingual space, submandibular glands and parotid
glands are within normal limits.

No discrete masticator space abnormality on either side.

Thyroid: Negative.

Lymph nodes: Reactive appearing right greater than left level 2
lymph nodes, enlarged, lobulated, and up to 15 mm short axis on the
right (series 10, image 36 and series 11, image 15). No cystic or
necrotic nodes. The right level 1 and level 3 nodal stations are
less affected.

Vascular: Major vascular structures in the neck and at the skullbase
are patent including the right IJ.

Limited intracranial: Negative.

Orbits: See face findings below.

Mastoids and visualized paranasal sinuses: See face findings below.

Skeleton: No carious dentition identified. Mandible intact. Negative
CT appearance of the cervical spine. No acute osseous abnormality
identified.

Upper chest: Negative lung apices and visualized superior
mediastinum.

CT FACE FINDINGS

Visible Skull: Negative.  No acute osseous abnormality identified.

Sinuses and Mastoids: The bilateral tympanic cavities and mastoids
are clear.

The paranasal sinuses are clear aside from a rounded 2.4 cm probable
right maxillary mucous retention cyst.

Orbits: Visualized orbits and scalp soft tissues are within normal
limits.

Other: Right palatine tonsillar and parapharyngeal space abnormality
as stated above.
IMPRESSION: 1. Positive for Acute Tonsillitis with developing right side
Intra-tonsillar Abscess (12-14 mm diameter). Associated inflammation
in the right parapharyngeal space. Possible trace retropharyngeal
effusion, but no other abscess or complicating features.
2. Reactive right greater than left cervical lymphadenopathy. No
suppurated lymph nodes.
3. Middle ears and mastoids appear normal. No osseous abnormality
identified.

## 2019-08-13 ENCOUNTER — Encounter: Payer: Self-pay | Admitting: Emergency Medicine

## 2019-08-13 ENCOUNTER — Emergency Department
Admission: EM | Admit: 2019-08-13 | Discharge: 2019-08-13 | Disposition: A | Discharge status: Home / Self Care | Payer: BC Managed Care – PPO | Source: Home / Self Care

## 2019-08-13 ENCOUNTER — Other Ambulatory Visit: Payer: Self-pay

## 2019-08-13 DIAGNOSIS — R079 Chest pain, unspecified: Secondary | ICD-10-CM | POA: Diagnosis not present

## 2019-08-13 LAB — POCT CBC W AUTO DIFF (K'VILLE URGENT CARE)

## 2019-08-13 LAB — COMPLETE METABOLIC PANEL WITH GFR
AG Ratio: 1.6 (calc) (ref 1.0–2.5)
ALT: 15 U/L (ref 6–29)
AST: 20 U/L (ref 10–30)
Albumin: 4.5 g/dL (ref 3.6–5.1)
Alkaline phosphatase (APISO): 42 U/L (ref 31–125)
BUN: 19 mg/dL (ref 7–25)
CO2: 25 mmol/L (ref 20–32)
Calcium: 9.9 mg/dL (ref 8.6–10.2)
Chloride: 102 mmol/L (ref 98–110)
Creat: 0.84 mg/dL (ref 0.50–1.10)
GFR, Est African American: 106 mL/min/{1.73_m2} (ref >=60)
GFR, Est Non African American: 91 mL/min/{1.73_m2} (ref >=60)
Globulin: 2.8 g/dL (calc) (ref 1.9–3.7)
Glucose, Bld: 86 mg/dL (ref 65–99)
Potassium: 3.9 mmol/L (ref 3.5–5.3)
Sodium: 138 mmol/L (ref 135–146)
Total Bilirubin: 1.6 mg/dL — ABNORMAL HIGH (ref 0.2–1.2)
Total Protein: 7.3 g/dL (ref 6.1–8.1)

## 2019-08-13 LAB — TSH: TSH: 2.24 mIU/L

## 2019-08-13 NOTE — ED Triage Notes (Signed)
Breast discomfort and chest radiates to neck, feels veins pulsating x 5 days. Denies pain, tingling

## 2019-08-13 NOTE — Discharge Instructions (Signed)
°  Be sure to stay well hydrated and get at least 8 hours of sleep at night. Limit your upper body work out/lifting this week to see if that helps with your symptoms as your muscle may still be fatigued from last week.  Please call to schedule a follow up exam later this week or next week with your family doctor if not improving.  Call 911 or go to the hospital if symptoms worsening- chest pain, trouble breathing, severe headache, change in vision, dizziness, passing out, or other new concerning symptoms develop.

## 2019-08-13 NOTE — ED Provider Notes (Signed)
[name]    CSN: [id number] Arrival date & time: 08/13/19  1044      History   Chief Complaint Chief Complaint  Patient presents with  . Neck Pain    "pulsations in upper check and into neck"    HPI Regina Hogan is a 34 y.o. female.   HPI  Regina Hogan is a 34 y.o. female presenting to UC with c/o bilateral breast and upper chest discomfort described as "pulsations" in her upper chest going into her neck over the last 5 days.  Pt denies pain, SOB, fever, chills, cough, congestion, n/v/d. No HA or dizziness. Pt reports feeling similar back in February just before finding out she had a miscarriage. LMP 1 month ago, pt is due today. She took a home urine pregnancy test today, it was negative. Denies abdominal pain or vaginal symptoms.  Denies hx of heart problems but notes her dad has an arrhythmia.  No other family cardiac hx. Denies hx of thyroid disease or anemia. She does report working out and lifting more last Wednesday, the day before symptoms started but she does not recall a specific injury.  She does take a multivitamin. She was taking "preworkout" supplement but stopped taking when symptoms started. She had taken it in the past w/o symptoms.    Past Medical History:  Diagnosis Date  . Seasonal allergies 06/08/2016    Patient Active Problem List   Diagnosis Date Noted  . Tonsillar abscess 01/20/2017  . Tonsillitis 06/08/2016  . Seasonal allergies 06/08/2016  . Eustachian tube dysfunction 01/30/2014    Past Surgical History:  Procedure Laterality Date  . TONSILLECTOMY      OB History    Gravida  1   Para      Term      Preterm      AB      Living        SAB      TAB      Ectopic      Multiple      Live Births               Home Medications    Prior to Admission medications   Medication Sig Start Date End Date Taking? Authorizing Provider  fluticasone (FLONASE) 50 MCG/ACT nasal spray Place into both nostrils  daily.    [provider]  Magnesium 300 MG CAPS One by mouth daily. 03/28/15   Hommel, Hilliard Clark, DO  montelukast (SINGULAIR) 10 MG tablet TAKE 1 TABLET BY MOUTH EVERYDAY AT BEDTIME 08/31/18   Emeterio Reeve, DO  Probiotic Product (ACIDOPHILUS/GOAT MILK) CAPS Take by mouth.    [provider]  Riboflavin (VITAMIN B2 PO) Take by mouth.    [provider]    Family History Family History  Problem Relation Age of Onset  . Cancer - Other Other        grandfather   . Depression Other        grandmother   . Hyperlipidemia Mother   . Fainting Father   . Arrhythmia Father     Social History Social History   Tobacco Use  . Smoking status: Never Smoker  . Smokeless tobacco: Never Used  Substance Use Topics  . Alcohol use: Yes    Alcohol/week: 0.0 standard drinks    Comment: 4 dirnks per week or so  . Drug use: No     Allergies   Penicillins   Review of Systems Review of Systems  Constitutional:  Negative for chills and fever.  HENT: Negative for congestion, ear pain, sore throat, trouble swallowing and voice change.   Respiratory: Negative for cough and shortness of breath.   Cardiovascular: Positive for palpitations ( "pulsations"). Negative for chest pain.  Gastrointestinal: Negative for abdominal pain, diarrhea, nausea and vomiting.  Genitourinary: Negative for dysuria, frequency, hematuria, menstrual problem, pelvic pain, vaginal bleeding, vaginal discharge and vaginal pain.  Musculoskeletal: Negative for arthralgias, back pain and myalgias.  Skin: Negative for rash.  Neurological: Negative for dizziness and headaches.  All other systems reviewed and are negative.    Physical Exam Triage Vital Signs ED Triage Vitals  Enc Vitals Group     BP 08/13/19 1104 128/81     Pulse Rate 08/13/19 1104 65     Resp --      Temp 08/13/19 1104 98.5 F (36.9 C)     Temp Source 08/13/19 1104 Oral     SpO2 08/13/19 1104 100 %     Weight 08/13/19 1105 140  lb (63.5 kg)     Height 08/13/19 1105 5\' 4"  (1.626 m)     Head Circumference --      Peak Flow --      Pain Score 08/13/19 1104 1     Pain Loc --      Pain Edu? --      Excl. in [place]? --    No data found.  Updated Vital Signs BP 128/81 (BP Location: Right Arm)   Pulse 65   Temp 98.5 F (36.9 C) (Oral)   Ht 5\' 4"  (1.626 m)   Wt 140 lb (63.5 kg)   SpO2 100%   BMI 24.03 kg/m   Visual Acuity Right Eye Distance:   Left Eye Distance:   Bilateral Distance:    Right Eye Near:   Left Eye Near:    Bilateral Near:     Physical Exam Vitals and nursing note reviewed.  Constitutional:      General: She is not in acute distress.    Appearance: Normal appearance. She is well-developed. She is not ill-appearing, toxic-appearing or diaphoretic.  HENT:     Head: Normocephalic and atraumatic.     Nose: Nose normal.     Mouth/Throat:     Mouth: Mucous membranes are moist.  Eyes:     General: No scleral icterus. Cardiovascular:     Rate and Rhythm: Normal rate and regular rhythm.  Pulmonary:     Effort: Pulmonary effort is normal. No respiratory distress.     Breath sounds: Normal breath sounds. No stridor. No wheezing, rhonchi or rales.  Chest:     Chest wall: No tenderness.  Abdominal:     General: There is no distension.     Palpations: Abdomen is soft.     Tenderness: There is no abdominal tenderness. There is no right CVA tenderness or left CVA tenderness.  Musculoskeletal:        General: Normal range of motion.     Cervical back: Normal range of motion and neck supple.  Skin:    General: Skin is warm and dry.  Neurological:     Mental Status: She is alert and oriented to person, place, and time.  Psychiatric:        Mood and Affect: Mood normal. Affect is tearful ( when discussing recent miscarriage).        Behavior: Behavior normal.      UC Treatments / Results  Labs (all labs ordered are listed, but  only abnormal results are displayed) Labs Reviewed  COMPLETE  METABOLIC PANEL WITH GFR  TSH  POCT CBC W AUTO DIFF ([facility])    EKG Date/Time: 08/13/2019 11:27:23     Ventricular Rate: 71 PR Interval: 162 QRS Duration: 82 QT Interval: 432 QTC Calculation: 469 P-R-T axes: 63   95   50 Text Interpretation: Normal sinus rhythm, rightward axis, borderline ECG    Radiology No results found.  Procedures Procedures (including critical care time)  Medications Ordered in UC Medications - No data to display  Initial Impression / Assessment and Plan / UC Course  I have reviewed the triage vital signs and the nursing notes.  Pertinent labs & imaging results that were available during my care of the patient were reviewed by me and considered in my medical decision making (see chart for details).     EKG unremarkable CBC: WNL CMP and TSH pending Pt tearful discussing recent miscarriage, discussed getting beta-hCG as it could be too early for urine pregnancy test (pt due for period today) pt declined today.  Due to reported recent weightlifting day prior, suspect possible muscle strain/tremors/spasms.   Reassured pt of normal vitals and workup in UC, will notify if remaining labs come back abnormal  Encouraged f/u with PCP Discussed symptoms that warrant emergent care in the ED. AVS provided   Final Clinical Impressions(s) / UC Diagnoses   Final diagnoses:  Chest pain, unspecified type     Discharge Instructions      Be sure to stay well hydrated and get at least 8 hours of sleep at night. Limit your upper body work out/lifting this week to see if that helps with your symptoms as your muscle may still be fatigued from last week.  Please call to schedule a follow up exam later this week or next week with your family doctor if not improving.  Call 911 or go to the hospital if symptoms worsening- chest pain, trouble breathing, severe headache, change in vision, dizziness, passing out, or other new concerning symptoms  develop.     ED Prescriptions    None     PDMP not reviewed this encounter.   Noe Gens, [place] 08/13/19 1325

## 2019-08-13 NOTE — Progress Notes (Signed)
Erroneous encounter

## 2019-08-14 ENCOUNTER — Encounter: Payer: BC Managed Care – PPO | Admitting: Medical-Surgical

## 2019-08-22 ENCOUNTER — Encounter: Payer: Self-pay | Admitting: Medical-Surgical

## 2019-08-22 ENCOUNTER — Ambulatory Visit (INDEPENDENT_AMBULATORY_CARE_PROVIDER_SITE_OTHER): Payer: BC Managed Care – PPO | Admitting: Medical-Surgical

## 2019-08-22 VITALS — BP 123/77 | HR 56 | Temp 98.1°F | Ht 64.0 in | Wt 137.0 lb

## 2019-08-22 DIAGNOSIS — S161XXD Strain of muscle, fascia and tendon at neck level, subsequent encounter: Secondary | ICD-10-CM

## 2019-08-22 NOTE — Progress Notes (Signed)
Subjective:    CC: follow up from UC, neck discomfort  HPI: Pleasant 34 year old female presenting for follow up from urgent care visit and for right neck discomfort.   Was seen in urgent care on 08/13/2019 for neck/chest discomfort. At that time she had a normal EKG and lab work up. She feels that the provider there did not fully understand what she was telling them about her symptoms and admits that she had a hard time explaining it. She reports that she never felt any pain. Instead it was a pulsation similar to the feeling of pulsation after having a finger smashed and the pain recedes. She has noted an improvement in these symptoms but reports she is having some discomfort along the right side of her neck and along the right trapezius muscle. She endorses heavy lifting and strenuous regular exercise. Took Tylenol yesterday and noted an improvement in her neck discomfort.  I reviewed the past medical history, family history, social history, surgical history, and allergies today and no changes were needed.  Please see the problem list section below in epic for further details.  Past Medical History: Past Medical History:  Diagnosis Date  . Seasonal allergies 06/08/2016   Past Surgical History: Past Surgical History:  Procedure Laterality Date  . TONSILLECTOMY     Social History: Social History   Socioeconomic History  . Marital status: Married    Spouse name: Not on file  . Number of children: Not on file  . Years of education: Not on file  . Highest education level: Not on file  Occupational History  . Occupation: Pharmacist, hospital    Comment: Oncologist   Tobacco Use  . Smoking status: Never Smoker  . Smokeless tobacco: Never Used  Substance and Sexual Activity  . Alcohol use: Yes    Alcohol/week: 0.0 standard drinks    Comment: 4 dirnks per week or so  . Drug use: No  . Sexual activity: Yes    Partners: Male    Birth control/protection: Pill  Other Topics Concern  .  Not on file  Social History Narrative  . Not on file   Social Determinants of Health   Financial Resource Strain:   . Difficulty of Paying Living Expenses:   Food Insecurity:   . Worried About Charity fundraiser in the Last Year:   . Arboriculturist in the Last Year:   Transportation Needs:   . Film/video editor (Medical):   Marland Kitchen Lack of Transportation (Non-Medical):   Physical Activity:   . Days of Exercise per Week:   . Minutes of Exercise per Session:   Stress:   . Feeling of Stress :   Social Connections:   . Frequency of Communication with Friends and Family:   . Frequency of Social Gatherings with Friends and Family:   . Attends Religious Services:   . Active Member of Clubs or Organizations:   . Attends Archivist Meetings:   Marland Kitchen Marital Status:    Family History: Family History  Problem Relation Age of Onset  . Cancer - Other Other        grandfather   . Depression Other        grandmother   . Hyperlipidemia Mother   . Fainting Father   . Arrhythmia Father    Allergies: Allergies  Allergen Reactions  . Penicillins Rash    As a child   Medications: See med rec.  Review of Systems: See HPI for pertinent  positives and negatives.   Objective:    General: Well Developed, well nourished, and in no acute distress.  Neuro: Alert and oriented x3.  HEENT: Normocephalic, atraumatic.  Skin: Warm and dry. Cardiac: Regular rate and rhythm, no murmurs rubs or gallops, no lower extremity edema.  Respiratory: Clear to auscultation bilaterally. Not using accessory muscles, speaking in full sentences. MSK: Mild muscle tenderness along the right neck and the upper border of the trapezius muscle but no edema or muscle spasm noted. Full ROM to the neck and upper extremities.   Impression and Recommendations:    1. Strain of neck muscle, subsequent encounter Recommend Tylenol or Ibuprofen for neck discomfort. Advised to use heat/ice per comfort and limit  heavy lifting for several weeks. Be aware of exercise tolerance and avoid overusing the muscles involved. Discussed possible muscle relaxers but patient declined.  Return if symptoms worsen or fail to improve. ___________________________________________ Clearnce Sorrel, DNP, APRN, FNP-BC [facility] [place]

## 2019-10-09 ENCOUNTER — Other Ambulatory Visit: Payer: Self-pay | Admitting: Osteopathic Medicine

## 2019-10-16 ENCOUNTER — Encounter: Payer: Self-pay | Admitting: Osteopathic Medicine

## 2019-10-16 ENCOUNTER — Ambulatory Visit (INDEPENDENT_AMBULATORY_CARE_PROVIDER_SITE_OTHER): Payer: BC Managed Care – PPO | Admitting: Osteopathic Medicine

## 2019-10-16 ENCOUNTER — Other Ambulatory Visit: Payer: Self-pay

## 2019-10-16 VITALS — BP 109/64 | HR 57 | Ht 64.0 in | Wt 140.0 lb

## 2019-10-16 DIAGNOSIS — R002 Palpitations: Secondary | ICD-10-CM

## 2019-10-16 DIAGNOSIS — Z0000 Encounter for general adult medical examination without abnormal findings: Secondary | ICD-10-CM

## 2019-10-16 DIAGNOSIS — M542 Cervicalgia: Secondary | ICD-10-CM | POA: Diagnosis not present

## 2019-10-16 LAB — CBC
HCT: 42.5 % (ref 35.0–45.0)
Hemoglobin: 14.1 g/dL (ref 11.7–15.5)
MCH: 30.8 pg (ref 27.0–33.0)
MCHC: 33.2 g/dL (ref 32.0–36.0)
MCV: 92.8 fL (ref 80.0–100.0)
MPV: 10.2 fL (ref 7.5–12.5)
Platelets: 269 10*3/uL (ref 140–400)
RBC: 4.58 10*6/uL (ref 3.80–5.10)
RDW: 12.5 % (ref 11.0–15.0)
WBC: 5.8 10*3/uL (ref 3.8–10.8)

## 2019-10-16 LAB — LIPID PANEL
Cholesterol: 176 mg/dL (ref <200)
HDL: 75 mg/dL (ref >=50)
LDL Cholesterol (Calc): 86 mg/dL (calc)
Non-HDL Cholesterol (Calc): 101 mg/dL (calc) (ref <130)
Total CHOL/HDL Ratio: 2.3 (calc) (ref <5.0)
Triglycerides: 65 mg/dL (ref <150)

## 2019-10-16 LAB — COMPLETE METABOLIC PANEL WITH GFR
AG Ratio: 1.7 (calc) (ref 1.0–2.5)
ALT: 10 U/L (ref 6–29)
AST: 17 U/L (ref 10–30)
Albumin: 4.2 g/dL (ref 3.6–5.1)
Alkaline phosphatase (APISO): 41 U/L (ref 31–125)
BUN: 15 mg/dL (ref 7–25)
CO2: 27 mmol/L (ref 20–32)
Calcium: 9.2 mg/dL (ref 8.6–10.2)
Chloride: 104 mmol/L (ref 98–110)
Creat: 0.76 mg/dL (ref 0.50–1.10)
GFR, Est African American: 119 mL/min/{1.73_m2} (ref >=60)
GFR, Est Non African American: 103 mL/min/{1.73_m2} (ref >=60)
Globulin: 2.5 g/dL (calc) (ref 1.9–3.7)
Glucose, Bld: 89 mg/dL (ref 65–99)
Potassium: 4.1 mmol/L (ref 3.5–5.3)
Sodium: 140 mmol/L (ref 135–146)
Total Bilirubin: 1.6 mg/dL — ABNORMAL HIGH (ref 0.2–1.2)
Total Protein: 6.7 g/dL (ref 6.1–8.1)

## 2019-10-16 LAB — TSH: TSH: 2.31 mIU/L

## 2019-10-16 NOTE — Progress Notes (Signed)
Regina Hogan is a 34 y.o. female who presents to  [place] at [facility]  today, 10/16/19, seeking care for the following:  . Annual physical  . Palpitations - feels thumping in chest and into neck/legs, intermittent, few minutes long episodes, happening daily. Exercises without difficulty, no CP/SOB, no syncope/presyncope. States she gets sharp pain into R neck along course of carotid/jugular vasculature, no headache/cision change     ASSESSMENT & PLAN with other pertinent findings:  The primary encounter diagnosis was Annual physical exam. Diagnoses of Palpitations and Neck pain were also pertinent to this visit.   No results found for this or any previous visit (from the past 24 hour(s)).  Constitutional:  . VSS, see nurse notes . General Appearance: alert, well-developed, well-nourished, NAD Eyes: Marland Kitchen Normal lids and conjunctive, non-icteric sclera . PERRLA Ears, Nose, Mouth, Throat: . Normal appearance . Normal external auditory canal and TM bilaterally Neck: . No masses, trachea midline . No thyroid enlargement/tenderness/mass appreciated Respiratory: . Normal respiratory effort . Breath sounds normal, no wheeze/rhonchi/rales Cardiovascular: . S1/S2 normal, no murmur/rub/gallop auscultated . No carotid bruit or JVD . Pedal pulse II/IV bilaterally DP and PT . No lower extremity edema Gastrointestinal: . Nontender, no masses . No hepatomegaly, no splenomegaly . No hernia appreciated Musculoskeletal:  . Gait normal . No clubbing/cyanosis of digits Neurological: . No cranial nerve deficit on limited exam . Motor and sensation intact and symmetric Psychiatric: . Normal judgment/insight . Normal mood and affect    Patient Instructions  General Preventive Care  Most recent routine screening labs: ordered.   Blood pressure goal 130/80 or less.   Tobacco: don't! Alcohol: responsible moderation is ok for most  adults - if you have concerns about your alcohol intake, please talk to me!   Exercise: as tolerated to reduce risk of cardiovascular disease and diabetes. Strength training will also prevent osteoporosis.   Mental health: if need for mental health care (medicines, counseling, other), or concerns about moods, please let me know!   Sexual / Reproductive health: if need for STD testing, or if concerns with libido/pain problems, please let me know! If you need to discuss family planning, please let me know!   Advanced Directive: Living Will and/or Healthcare Power of Attorney recommended for all adults, regardless of age or health.  Vaccines  Flu vaccine: for almost everyone, every fall.   Shingles vaccine: after age 73.   Pneumonia vaccines: after age 52, or sooner if certain medical conditions.  Tetanus booster: Done 2017. Booster every 10 years / 3rd trimester of pregnancy  HPV vaccine: Gardasil up to age 47 to prevent HPV-associated diseases, including certain cancers.   COVID vaccine: STRONGLY RECOMMENDED  Cancer screenings   Colon cancer screening: for everyone age 58-75.   Breast cancer screening: mammogram at age 48  Cervical cancer screening: Pap due 2024   Lung cancer screening: not needed for non-smokers  Infection screenings  . HIV: recommended screening at least once age 31-65 . Gonorrhea/Chlamydia: screening as needed . Hepatitis C: recommended once for everyone age 45-75 . TB: certain at-risk populations Other . Bone Density Test: recommended for women at age 49          The final decision is always yours, but you have no medical reason not to receive the COVID vaccine, and I therefore strongly recommended you get the vaccine. If you decide not to get vaccinated, please only leave home for essential functions, please be diligent about  masking over nose and mouth at all times if/when you are out, and please do not socialize with anyone outside your immediate  household!     Please note: Preventive care issues were addressed today as part of your annual wellness physical, and this care should be covered under your insurance. However there were other medical issues which were also addressed today, and insurance may bill you separately for a "problem-based visit" for this care: neck pain, palpitations, cut on finger. Any questions or concerns about charges which may appear on your billing statement should be directed to your insurance company or to [facility] billing department, and they will contact our office if there are further concerns.      Orders Placed This Encounter  Procedures  . US Carotid Bilateral  . CBC  . COMPLETE METABOLIC PANEL WITH GFR  . Lipid panel  . TSH  . LONG TERM MONITOR-LIVE TELEMETRY (3-14 DAYS)  . ECHOCARDIOGRAM COMPLETE    No orders of the defined types were placed in this encounter.      Follow-up instructions: Return in about 1 year (around 10/15/2020) for [place] (call week prior to visit for lab orders).                                         BP (!) 109/64 (BP Location: Left Arm, Patient Position: Sitting)   Pulse 57   Ht 5\' 4"  (1.626 m)   Wt 140 lb (63.5 kg)   LMP 10/11/2019 (Exact Date)   SpO2 100%   BMI 24.03 kg/m   Current Meds  Medication Sig  . ELDERBERRY PO Take 1 capsule by mouth daily.  . fluticasone (FLONASE) 50 MCG/ACT nasal spray Place into both nostrils daily.  . Magnesium 300 MG CAPS One by mouth daily.  . montelukast (SINGULAIR) 10 MG tablet TAKE 1 TABLET BY MOUTH EVERYDAY AT BEDTIME  . Probiotic Product (ACIDOPHILUS/GOAT MILK) CAPS Take by mouth.  . Riboflavin (VITAMIN B2 PO) Take by mouth.    No results found for this or any previous visit (from the past 72 hour(s)).  No results found.     All questions at time of visit were answered - patient instructed to contact office with any additional concerns or updates.  ER/RTC precautions  were reviewed with the patient as applicable.   Please note: voice recognition software was used to produce this document, and typos may escape review. Please contact Dr. Sheppard Coil for any needed clarifications.   Total encounter time spent on problem-based portion of visit 30 minutes.

## 2019-10-16 NOTE — Patient Instructions (Addendum)
General Preventive Care  Most recent routine screening labs: ordered.   Blood pressure goal 130/80 or less.   Tobacco: don't! Alcohol: responsible moderation is ok for most adults - if you have concerns about your alcohol intake, please talk to me!   Exercise: as tolerated to reduce risk of cardiovascular disease and diabetes. Strength training will also prevent osteoporosis.   Mental health: if need for mental health care (medicines, counseling, other), or concerns about moods, please let me know!   Sexual / Reproductive health: if need for STD testing, or if concerns with libido/pain problems, please let me know! If you need to discuss family planning, please let me know!   Advanced Directive: Living Will and/or Healthcare Power of Attorney recommended for all adults, regardless of age or health.  Vaccines  Flu vaccine: for almost everyone, every fall.   Shingles vaccine: after age 52.   Pneumonia vaccines: after age 10, or sooner if certain medical conditions.  Tetanus booster: Done 2017. Booster every 10 years / 3rd trimester of pregnancy  HPV vaccine: Gardasil up to age 94 to prevent HPV-associated diseases, including certain cancers.   COVID vaccine: STRONGLY RECOMMENDED  Cancer screenings   Colon cancer screening: for everyone age 70-75.   Breast cancer screening: mammogram at age 66  Cervical cancer screening: Pap due 2024   Lung cancer screening: not needed for non-smokers  Infection screenings  . HIV: recommended screening at least once age 29-65 . Gonorrhea/Chlamydia: screening as needed . Hepatitis C: recommended once for everyone age 84-75 . TB: certain at-risk populations Other . Bone Density Test: recommended for women at age 52          The final decision is always yours, but you have no medical reason not to receive the COVID vaccine, and I therefore strongly recommended you get the vaccine. If you decide not to get vaccinated, please only leave  home for essential functions, please be diligent about masking over nose and mouth at all times if/when you are out, and please do not socialize with anyone outside your immediate household!     Please note: Preventive care issues were addressed today as part of your annual wellness physical, and this care should be covered under your insurance. However there were other medical issues which were also addressed today, and insurance may bill you separately for a "problem-based visit" for this care: neck pain, palpitations, cut on finger. Any questions or concerns about charges which may appear on your billing statement should be directed to your insurance company or to [facility] billing department, and they will contact our office if there are further concerns.

## 2019-10-24 ENCOUNTER — Other Ambulatory Visit: Payer: Self-pay | Admitting: Osteopathic Medicine

## 2019-10-24 DIAGNOSIS — M542 Cervicalgia: Secondary | ICD-10-CM

## 2019-10-24 DIAGNOSIS — R002 Palpitations: Secondary | ICD-10-CM

## 2019-10-29 ENCOUNTER — Ambulatory Visit: Payer: BC Managed Care – PPO

## 2019-10-29 ENCOUNTER — Ambulatory Visit (HOSPITAL_COMMUNITY)
Admission: RE | Admit: 2019-10-29 | Discharge: 2019-10-29 | Disposition: A | Discharge status: Home / Self Care | Payer: BC Managed Care – PPO | Source: Ambulatory Visit | Attending: Osteopathic Medicine | Admitting: Osteopathic Medicine

## 2019-10-29 ENCOUNTER — Other Ambulatory Visit: Payer: Self-pay

## 2019-10-29 DIAGNOSIS — R002 Palpitations: Secondary | ICD-10-CM

## 2019-10-29 DIAGNOSIS — M542 Cervicalgia: Secondary | ICD-10-CM

## 2019-10-29 NOTE — Progress Notes (Signed)
  Echocardiogram 2D Echocardiogram has been performed.  Bobbye Charleston 10/29/2019, 10:50 AM

## 2019-10-30 LAB — ECHOCARDIOGRAM COMPLETE
Area-P 1/2: 4.8 cm2
Calc EF: 58.4 %
S' Lateral: 3.5 cm
Single Plane A2C EF: 57.7 %
Single Plane A4C EF: 61.1 %

## 2019-11-05 ENCOUNTER — Encounter: Payer: Self-pay | Admitting: Osteopathic Medicine

## 2020-10-28 ENCOUNTER — Other Ambulatory Visit: Payer: Self-pay | Admitting: Osteopathic Medicine

## 2020-11-26 ENCOUNTER — Other Ambulatory Visit: Payer: Self-pay | Admitting: Osteopathic Medicine

## 2020-12-10 ENCOUNTER — Telehealth: Payer: Self-pay | Admitting: General Practice

## 2020-12-10 NOTE — Telephone Encounter (Signed)
Transition Care Management Unsuccessful Follow-up Telephone Call  Date of discharge and from where:  12/09/20 from [facility]  Attempts:  1st Attempt  Reason for unsuccessful TCM follow-up call:  Left voice message

## 2020-12-12 NOTE — Telephone Encounter (Signed)
Transition Care Management Unsuccessful Follow-up Telephone Call  Date of discharge and from where:  12/09/20 from [facility]  Attempts:  2nd Attempt  Reason for unsuccessful TCM follow-up call:  Left voice message

## 2020-12-15 NOTE — Telephone Encounter (Signed)
Transition Care Management Unsuccessful Follow-up Telephone Call  Date of discharge and from where:  09/20/2 from [facility]  Attempts:  3rd Attempt  Reason for unsuccessful TCM follow-up call:  Left voice message

## 2020-12-20 ENCOUNTER — Other Ambulatory Visit: Payer: Self-pay | Admitting: Osteopathic Medicine

## 2021-01-15 ENCOUNTER — Other Ambulatory Visit: Payer: Self-pay | Admitting: Physician Assistant

## 2021-01-26 ENCOUNTER — Other Ambulatory Visit: Payer: Self-pay | Admitting: Physician Assistant

## 2022-01-18 ENCOUNTER — Ambulatory Visit (INDEPENDENT_AMBULATORY_CARE_PROVIDER_SITE_OTHER): Payer: BC Managed Care – PPO | Admitting: Family Medicine

## 2022-01-18 ENCOUNTER — Encounter: Payer: Self-pay | Admitting: Family Medicine

## 2022-01-18 VITALS — BP 112/52 | HR 52 | Ht 64.0 in | Wt 140.0 lb

## 2022-01-18 DIAGNOSIS — D229 Melanocytic nevi, unspecified: Secondary | ICD-10-CM

## 2022-01-18 DIAGNOSIS — Z0000 Encounter for general adult medical examination without abnormal findings: Secondary | ICD-10-CM | POA: Insufficient documentation

## 2022-01-18 NOTE — Progress Notes (Signed)
New Patient Office Visit  Subjective    Patient ID: Regina Hogan, female    DOB: 1986-02-26  Age: 36 y.o. MRN: [id number]  CC:  Chief Complaint  Patient presents with   Annual Exam    HPI Regina Hogan presents to establish care.   Diet: healthy Exercise: cardio and weight training 5-6  Screenings: Pap smears: up to date   Family hx: HTN: no  HLD: mom  DM: no  Cancer: lung cancer--grandfather was a smoker   Social hx: Alcohol use: 3-4 glasses; social  Tobacco (chew, smoke): no Sexually active: yes, one   Who do you live with: husband, 3 kids House or apt? house Safe at home: yes   Flu Vaccine: September  Covid Vaccine: September 2021; newest booster    Outpatient Encounter Medications as of 01/18/2022  Medication Sig   cetirizine (ZYRTEC) 10 MG tablet Take 10 mg by mouth daily.   cyanocobalamin 100 MCG tablet Take 100 mcg by mouth daily.   fluticasone (FLONASE) 50 MCG/ACT nasal spray Place into both nostrils daily.   Magnesium 300 MG CAPS One by mouth daily.   Probiotic Product (ACIDOPHILUS/GOAT MILK) CAPS Take by mouth.   Riboflavin (VITAMIN B2 PO) Take by mouth.   [DISCONTINUED] ELDERBERRY PO Take 1 capsule by mouth daily.   [DISCONTINUED] montelukast (SINGULAIR) 10 MG tablet TAKE 1 TABLET BY MOUTH EVERYDAY AT BEDTIME. NO REFILLS. NEED NEW MD   No facility-administered encounter medications on file as of 01/18/2022.    Past Medical History:  Diagnosis Date   Seasonal allergies 06/08/2016    Past Surgical History:  Procedure Laterality Date   TONSILLECTOMY      Family History  Problem Relation Age of Onset   Cancer - Other Other        grandfather    Depression Other        grandmother    Hyperlipidemia Mother    Fainting Father    Arrhythmia Father     Social History   Socioeconomic History   Marital status: Married    Spouse name: Not on file   Number of children: Not on file   Years of education: Not on file   Highest  education level: Not on file  Occupational History   Occupation: Pharmacist, hospital    Comment: kindergarten teacher   Tobacco Use   Smoking status: Never   Smokeless tobacco: Never  Vaping Use   Vaping Use: Never used  Substance and Sexual Activity   Alcohol use: Yes    Alcohol/week: 0.0 standard drinks of alcohol    Comment: 4 dirnks per week or so   Drug use: No   Sexual activity: Yes    Partners: Male    Birth control/protection: Pill  Other Topics Concern   Not on file  Social History Narrative   Not on file   Social Determinants of Health   Financial Resource Strain: Not on file  Food Insecurity: Not on file  Transportation Needs: Not on file  Physical Activity: Not on file  Stress: Not on file  Social Connections: Not on file  Intimate Partner Violence: Not on file    Review of Systems  Constitutional:  Negative for chills and fever.  Respiratory:  Negative for cough and shortness of breath.   Cardiovascular:  Negative for chest pain.  Neurological:  Negative for headaches.        Objective    BP (!) 112/52   Pulse (!) 52   Ht 5'  4" (1.626 m)   Wt 140 lb (63.5 kg)   SpO2 100%   BMI 24.03 kg/m   Physical Exam Vitals and nursing note reviewed.  Constitutional:      General: She is not in acute distress.    Appearance: Normal appearance.  HENT:     Head: Normocephalic and atraumatic.     Right Ear: Tympanic membrane, ear canal and external ear normal.     Left Ear: Tympanic membrane, ear canal and external ear normal.     Nose: Nose normal.  Eyes:     Conjunctiva/sclera: Conjunctivae normal.  Cardiovascular:     Rate and Rhythm: Normal rate and regular rhythm.  Pulmonary:     Effort: Pulmonary effort is normal.     Breath sounds: Normal breath sounds.  Abdominal:     General: Abdomen is flat. Bowel sounds are normal.     Palpations: Abdomen is soft.  Musculoskeletal:        General: Normal range of motion.     Cervical back: Neck supple.      Comments: 5/5 msk strength upper and lower extremity bilaterally  Lymphadenopathy:     Cervical: No cervical adenopathy.  Neurological:     General: No focal deficit present.     Mental Status: She is alert and oriented to person, place, and time.  Psychiatric:        Mood and Affect: Mood normal.        Behavior: Behavior normal.        Thought Content: Thought content normal.        Judgment: Judgment normal.         Assessment & Plan:   Problem List Items Addressed This Visit       Other   Well adult on routine health check - Primary    - pleasant 36 yo female presents for wellness exam.  - ordered CBC, lipid, CMP - pt mentions she is having continual conjunctivitis. Will go ahead and refer to ophtho       Relevant Orders   CBC   Lipid panel   COMPLETE METABOLIC PANEL WITH GFR   Ambulatory referral to Ophthalmology   Other Visit Diagnoses     Skin mole       Relevant Orders   Ambulatory referral to Dermatology       Return in about 1 year (around 01/19/2023) for wellness.   Owens Loffler, DO

## 2022-01-18 NOTE — Assessment & Plan Note (Addendum)
-   pleasant 36 yo female presents for wellness exam.  - ordered CBC, lipid, CMP - pt mentions she is having continual conjunctivitis. Will go ahead and refer to ophtho

## 2022-01-19 LAB — COMPLETE METABOLIC PANEL WITH GFR
AG Ratio: 1.7 (calc) (ref 1.0–2.5)
ALT: 12 U/L (ref 6–29)
AST: 18 U/L (ref 10–30)
Albumin: 4.4 g/dL (ref 3.6–5.1)
Alkaline phosphatase (APISO): 32 U/L (ref 31–125)
BUN: 15 mg/dL (ref 7–25)
CO2: 25 mmol/L (ref 20–32)
Calcium: 9.6 mg/dL (ref 8.6–10.2)
Chloride: 105 mmol/L (ref 98–110)
Creat: 0.8 mg/dL (ref 0.50–0.97)
Globulin: 2.6 g/dL (calc) (ref 1.9–3.7)
Glucose, Bld: 84 mg/dL (ref 65–99)
Potassium: 4.1 mmol/L (ref 3.5–5.3)
Sodium: 140 mmol/L (ref 135–146)
Total Bilirubin: 1.5 mg/dL — ABNORMAL HIGH (ref 0.2–1.2)
Total Protein: 7 g/dL (ref 6.1–8.1)
eGFR: 98 mL/min/{1.73_m2} (ref >=60)

## 2022-01-19 LAB — CBC
HCT: 40.7 % (ref 35.0–45.0)
Hemoglobin: 12.9 g/dL (ref 11.7–15.5)
MCH: 29.1 pg (ref 27.0–33.0)
MCHC: 31.7 g/dL — ABNORMAL LOW (ref 32.0–36.0)
MCV: 91.7 fL (ref 80.0–100.0)
MPV: 10.4 fL (ref 7.5–12.5)
Platelets: 247 10*3/uL (ref 140–400)
RBC: 4.44 10*6/uL (ref 3.80–5.10)
RDW: 12.8 % (ref 11.0–15.0)
WBC: 6.3 10*3/uL (ref 3.8–10.8)

## 2022-01-19 LAB — LIPID PANEL
Cholesterol: 166 mg/dL (ref <200)
HDL: 85 mg/dL (ref >=50)
LDL Cholesterol (Calc): 68 mg/dL (calc)
Non-HDL Cholesterol (Calc): 81 mg/dL (calc) (ref <130)
Total CHOL/HDL Ratio: 2 (calc) (ref <5.0)
Triglycerides: 57 mg/dL (ref <150)

## 2022-01-25 IMAGING — US US CAROTID DUPLEX BILAT
1 series · 14 of 24 positions shown · non-contrast
Comparison: None.

CLINICAL DATA: Throbbing over the right carotid artery.

EXAM:
BILATERAL CAROTID DUPLEX ULTRASOUND
TECHNIQUE: Gray scale imaging, color Doppler and duplex ultrasound were
performed of bilateral carotid and vertebral arteries in the neck.

[Series 1: us carotid duplex bilat · 0.04mm/px · 14 of 74 slices shown]
[im 1/74]
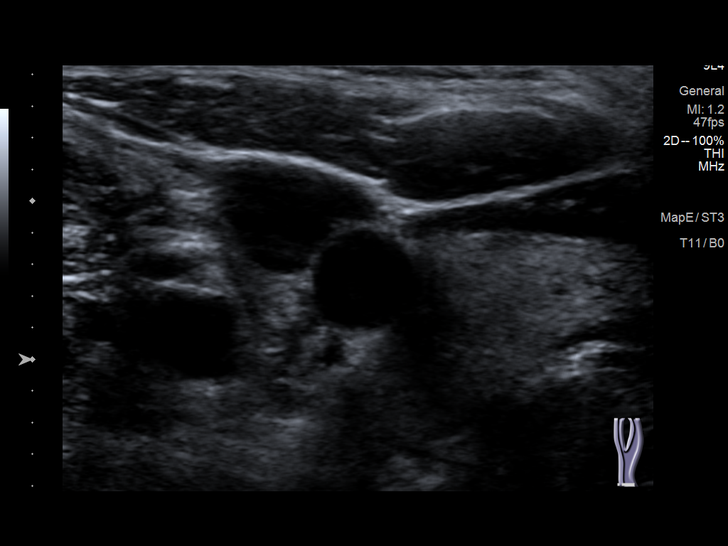
[im 7/74]
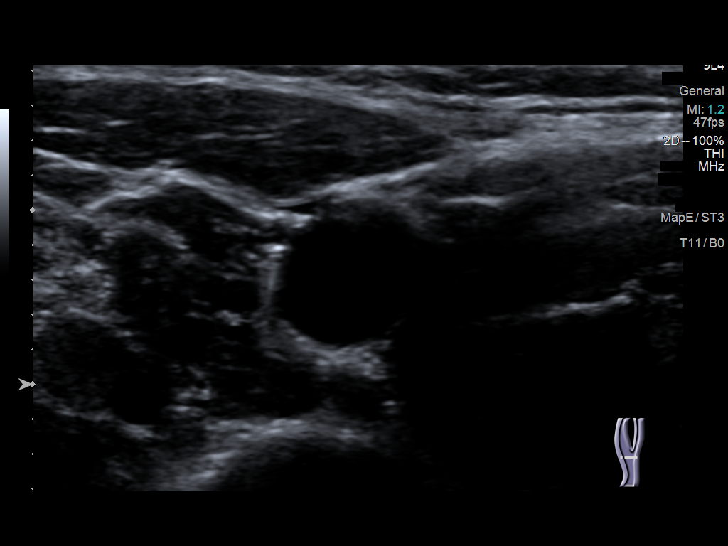
[im 13/74]
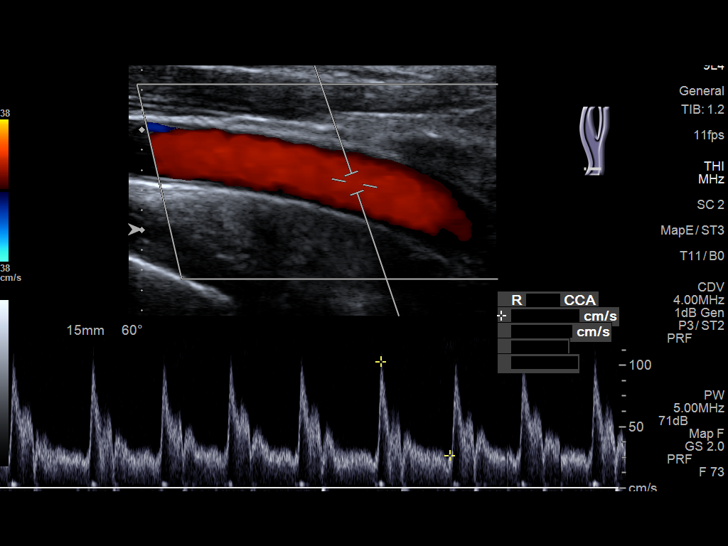
[im 20/74]
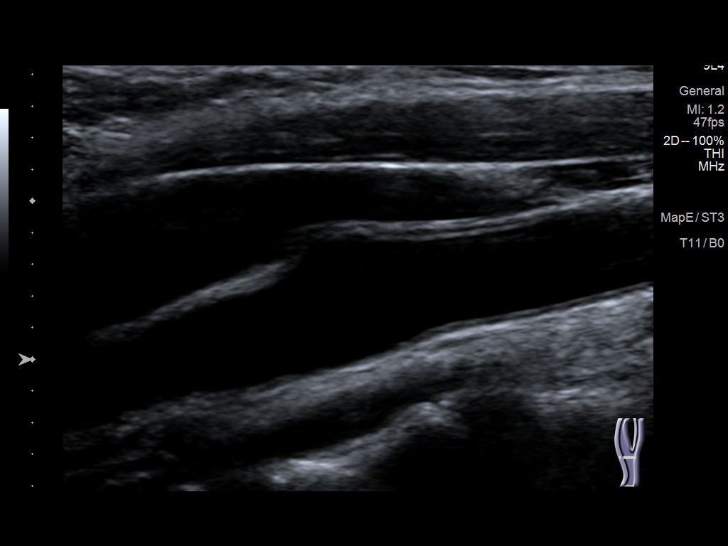
[im 23/74]
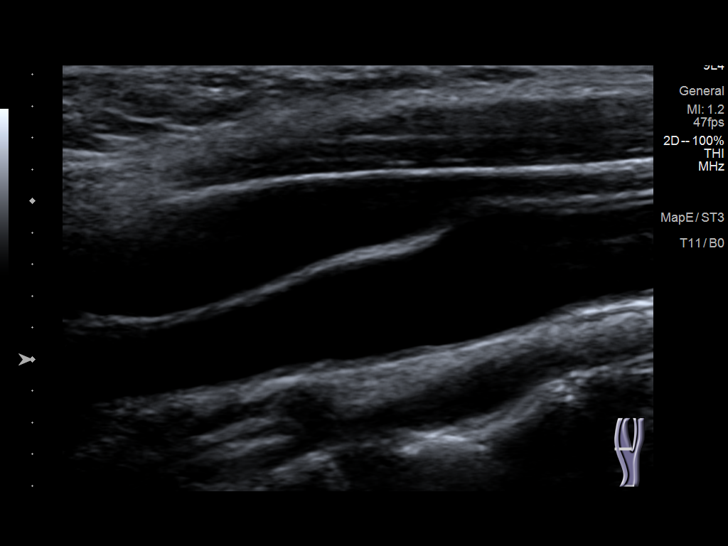
[im 29/74]
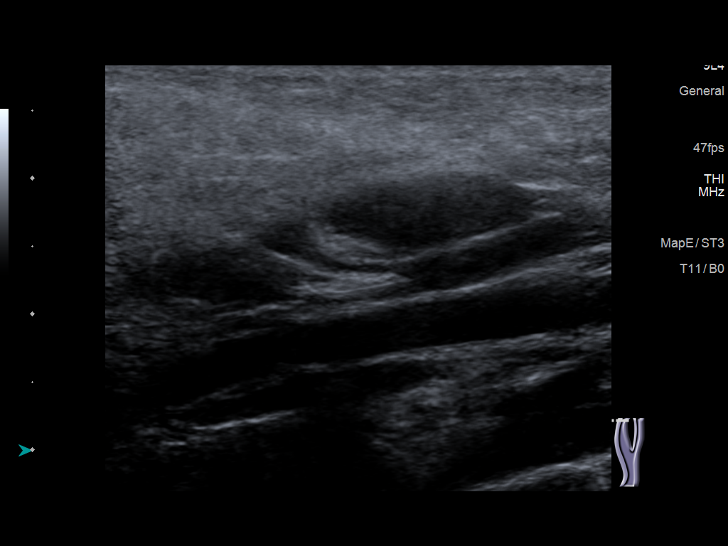
[im 35/74]
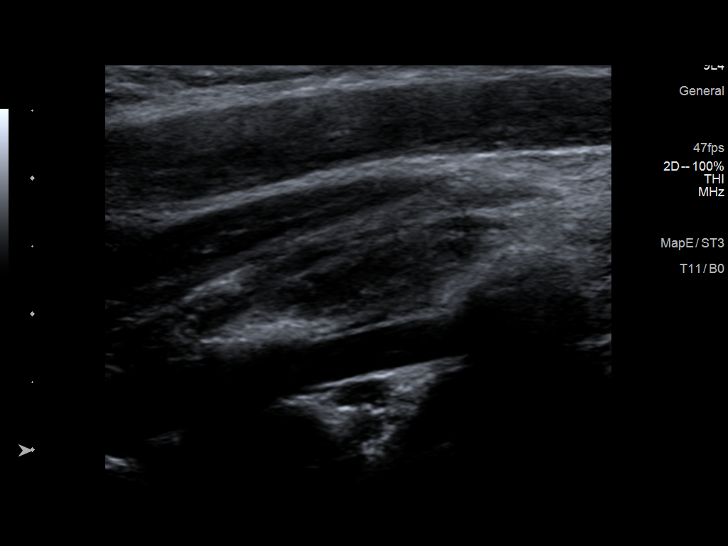
[im 39/74]
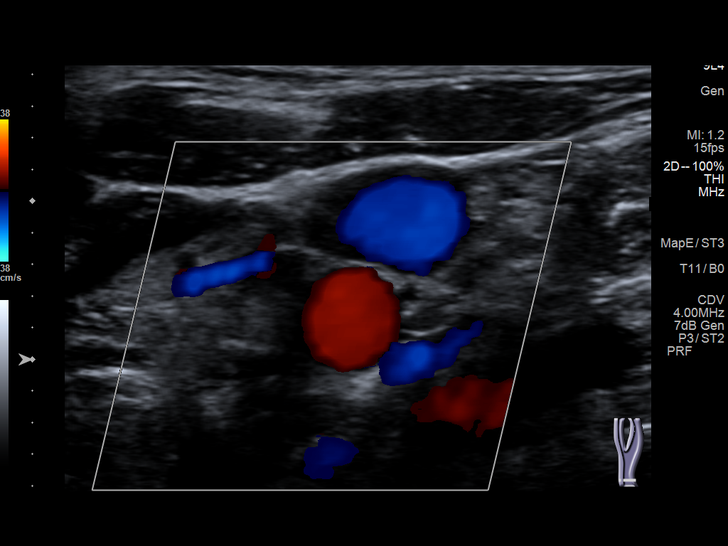
[im 45/74]
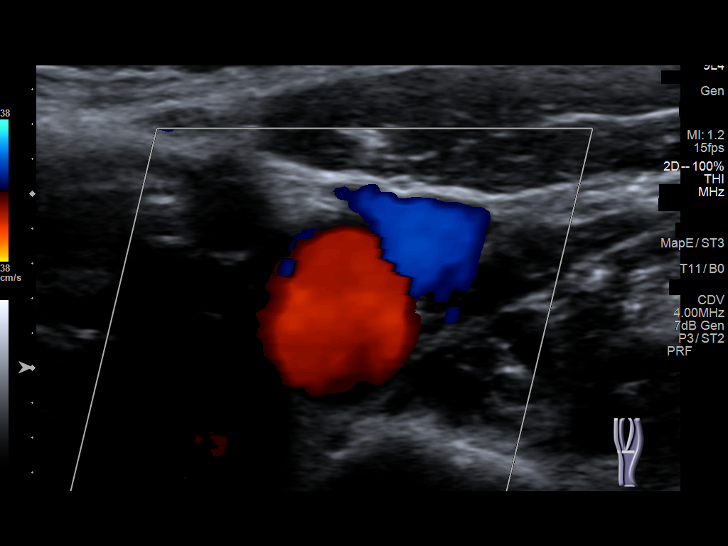
[im 51/74]
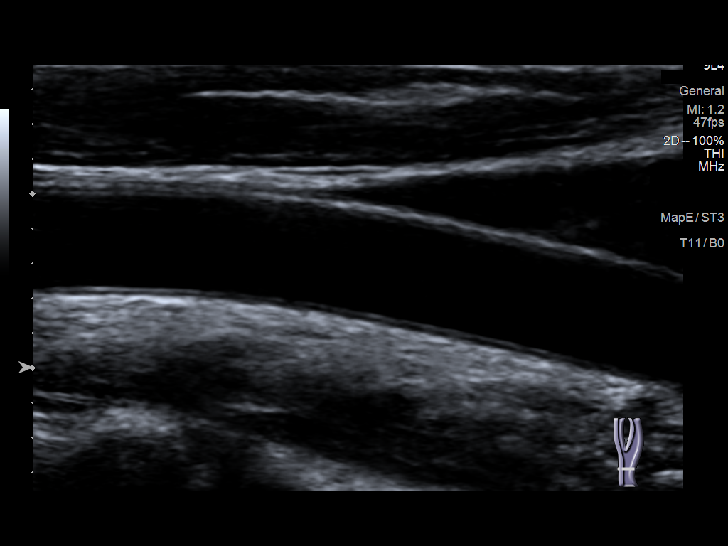
[im 58/74]
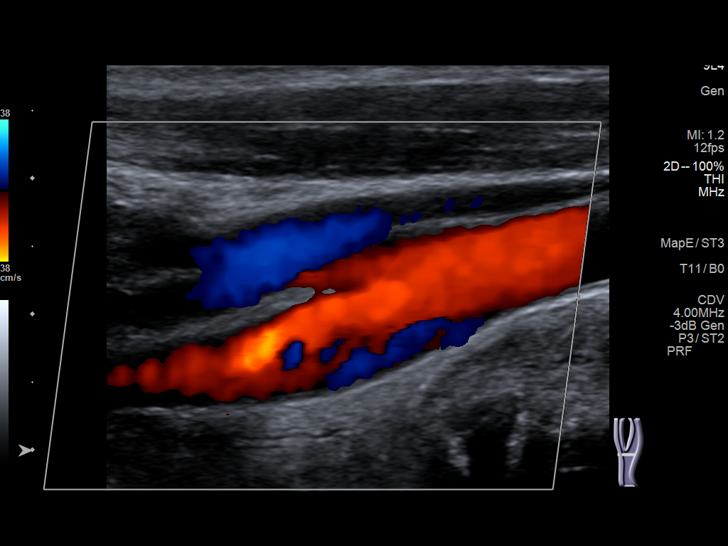
[im 61/74]
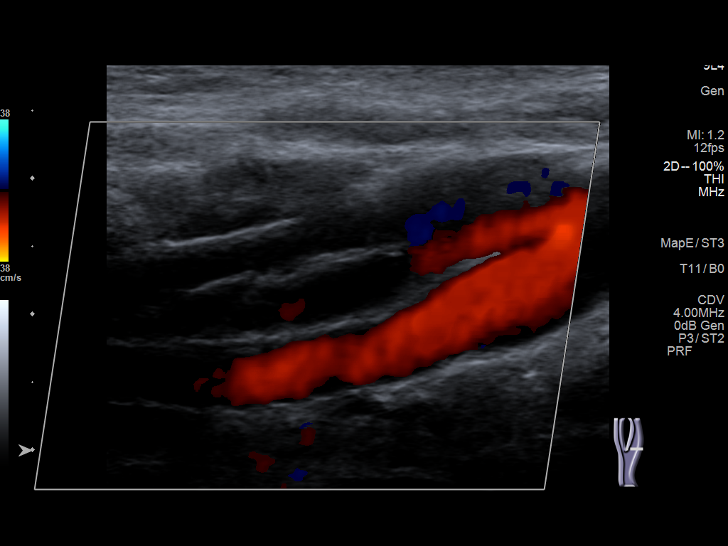
[im 67/74]
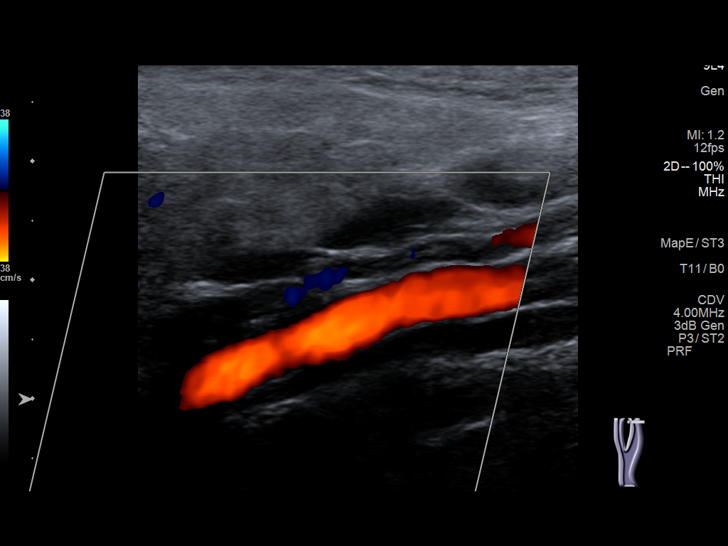
[im 74/74]
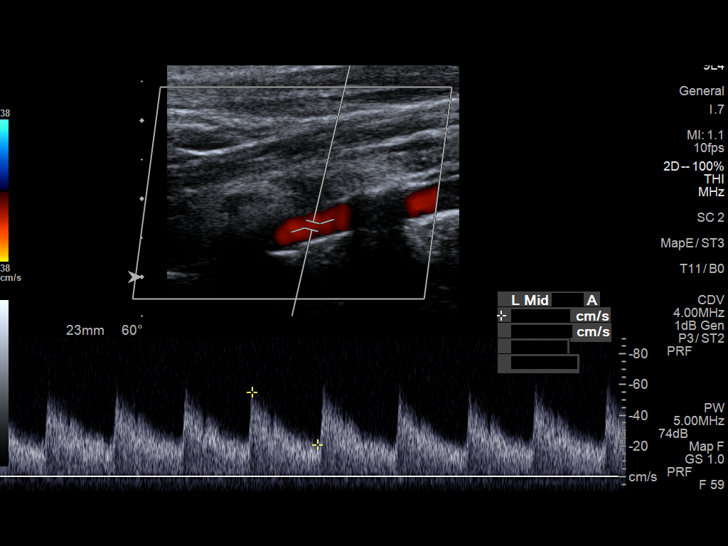

[14 of 24 positions shown; findings below may reference images not displayed]

FINDINGS: Criteria: Quantification of carotid stenosis is based on velocity
parameters that correlate the residual internal carotid diameter
with NASCET-based stenosis levels, using the diameter of the distal
internal carotid lumen as the denominator for stenosis measurement.

The following velocity measurements were obtained:

RIGHT

ICA: 82 cm/sec

CCA: 99 cm/sec

SYSTOLIC ICA/CCA RATIO:

ECA: 84 cm/sec

LEFT

ICA: 89 cm/sec

CCA: 118 cm/sec

SYSTOLIC ICA/CCA RATIO:

ECA: 93 cm/sec

RIGHT CAROTID ARTERY: No significant atherosclerotic disease was
noted.

RIGHT VERTEBRAL ARTERY:  Antegrade flow is noted.

LEFT CAROTID ARTERY: No significant atherosclerotic disease was
noted.

LEFT VERTEBRAL ARTERY:  Antegrade flow is noted.

Upper extremity blood pressures: RIGHT: 111/69 LEFT: 102/68
IMPRESSION: Unremarkable exam.

## 2022-02-01 ENCOUNTER — Encounter: Payer: Self-pay | Admitting: Family Medicine

## 2022-02-01 ENCOUNTER — Telehealth: Payer: Self-pay

## 2022-02-01 ENCOUNTER — Ambulatory Visit: Payer: BC Managed Care – PPO | Admitting: Family Medicine

## 2022-02-01 VITALS — BP 123/67 | HR 60 | Ht 64.0 in | Wt 144.0 lb

## 2022-02-01 DIAGNOSIS — J40 Bronchitis, not specified as acute or chronic: Secondary | ICD-10-CM | POA: Diagnosis not present

## 2022-02-01 DIAGNOSIS — J329 Chronic sinusitis, unspecified: Secondary | ICD-10-CM

## 2022-02-01 DIAGNOSIS — B9689 Other specified bacterial agents as the cause of diseases classified elsewhere: Secondary | ICD-10-CM | POA: Insufficient documentation

## 2022-02-01 MED ORDER — DOXYCYCLINE HYCLATE 100 MG PO TABS: 100.0000 mg | ORAL_TABLET | Freq: Two times a day (BID) | ORAL | 0 refills | Status: AC

## 2022-02-01 NOTE — Assessment & Plan Note (Addendum)
-   due to worsening symptoms and congestion coupled with sinus pain and tenderness to palpation will go ahead and treat with doxycycline  - left ear shows lymph node swelling which is likely from the drainage of the lymphatic system.  Discussed with patient that this could take a few weeks to return to normal

## 2022-02-01 NOTE — Progress Notes (Signed)
   Acute Office Visit  Subjective:     Patient ID: Regina Hogan, female    DOB: 08-Apr-1985, 36 y.o.   MRN: [id number]  Chief Complaint  Patient presents with   Mass    Behind left ear   Sinus Problem    HPI Patient is in today for feeling a lump behind her left ear. She has also noticed some congestion for the past few days. She thought she started to feel better and then a few days ago started to feel worse. She denies fevers. Admits to sinus pressure, pain and headaches.   Review of Systems  Constitutional:  Negative for chills and fever.  HENT:  Positive for sinus pain.   Respiratory:  Negative for cough and shortness of breath.   Cardiovascular:  Negative for chest pain.  Neurological:  Negative for headaches.        Objective:    BP 123/67   Pulse 60   Ht '5\' 4"'$  (1.626 m)   Wt 144 lb (65.3 kg)   SpO2 100%   BMI 24.72 kg/m    Physical Exam Vitals and nursing note reviewed.  Constitutional:      General: She is not in acute distress.    Appearance: Normal appearance.  HENT:     Head: Normocephalic and atraumatic.     Comments: Tenderness to palpation of maxillary and frontal sinuses bilaterally.    Right Ear: External ear normal.     Left Ear: External ear normal.     Ears:     Comments: Swollen lymph node behind left ear. No erythema or tenderness to palpation.    Nose: Nose normal.  Eyes:     Conjunctiva/sclera: Conjunctivae normal.  Cardiovascular:     Rate and Rhythm: Normal rate and regular rhythm.  Pulmonary:     Effort: Pulmonary effort is normal.     Breath sounds: Normal breath sounds.  Neurological:     General: No focal deficit present.     Mental Status: She is alert and oriented to person, place, and time.  Psychiatric:        Mood and Affect: Mood normal.        Behavior: Behavior normal.        Thought Content: Thought content normal.        Judgment: Judgment normal.     No results found for any visits on 02/01/22.       Assessment & Plan:   Problem List Items Addressed This Visit       Respiratory   Sinobronchitis - Primary    - due to worsening symptoms and congestion coupled with sinus pain and tenderness to palpation will go ahead and treat with doxycycline  - left ear shows lymph node swelling which is likely from the drainage of the lymphatic system.  Discussed with patient that this could take a few weeks to return to normal      Relevant Medications   doxycycline (VIBRA-TABS) 100 MG tablet    Meds ordered this encounter  Medications   doxycycline (VIBRA-TABS) 100 MG tablet    Sig: Take 1 tablet (100 mg total) by mouth 2 (two) times daily for 7 days.    Dispense:  14 tablet    Refill:  0      Owens Loffler, DO

## 2022-02-01 NOTE — Telephone Encounter (Signed)
Just documenting to sign encounter. No encounter created.

## 2022-12-16 ENCOUNTER — Encounter: Payer: Self-pay | Admitting: Sports Medicine

## 2022-12-16 ENCOUNTER — Ambulatory Visit: Payer: BC Managed Care – PPO | Admitting: Sports Medicine

## 2022-12-16 VITALS — BP 113/73 | HR 67 | Temp 97.8°F

## 2022-12-16 DIAGNOSIS — J40 Bronchitis, not specified as acute or chronic: Secondary | ICD-10-CM

## 2022-12-16 DIAGNOSIS — J329 Chronic sinusitis, unspecified: Secondary | ICD-10-CM | POA: Diagnosis not present

## 2022-12-16 LAB — POC COVID19 BINAXNOW: SARS Coronavirus 2 Ag: NEGATIVE

## 2022-12-16 LAB — POCT INFLUENZA A/B
Influenza A, POC: NEGATIVE
Influenza B, POC: NEGATIVE

## 2022-12-16 MED ORDER — AZITHROMYCIN 250 MG PO TABS: ORAL_TABLET | ORAL | 0 refills | Status: DC

## 2022-12-16 MED ORDER — PREDNISONE 50 MG PO TABS: 50.0000 mg | ORAL_TABLET | Freq: Every day | ORAL | 0 refills | Status: DC

## 2022-12-16 NOTE — Addendum Note (Signed)
Addended by: Carren Rang A on: 12/16/2022 04:23 PM   Modules accepted: Orders

## 2022-12-16 NOTE — Assessment & Plan Note (Addendum)
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 This is evolved into a sore throat and some burning sensation in her chest. No overt fevers or chills. 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 She also had an episode of sinobronchitis approximately 10 months ago. On exam she has shotty lymphadenopathy, the rest of the head and neck exam is unremarkable, lungs are clear. I explained to her that the diagnosis of sinobronchitis tells [place] the structure that is affected but not necessarily the pathogen causing. We did COVID and flu swabs today, both were negative. Though this does not exclude a viral pathology it does raise the likelihood of a bacterial pathology so we will do azithromycin, prednisone and she will do her oral decongestants at home.

## 2022-12-16 NOTE — Progress Notes (Signed)
    Procedures performed today:    None.  Independent interpretation of notes and tests performed by another provider:   None.  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 This is evolved into a sore throat and some burning sensation in her chest. No overt fevers or chills. 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 She also had an episode of sinobronchitis approximately 10 months ago. On exam she has shotty lymphadenopathy, the rest of the head and neck exam is unremarkable, lungs are clear. I explained to her that the diagnosis of sinobronchitis tells [place] the structure that is affected but not necessarily the pathogen causing. We did COVID and flu swabs today, both were negative. Though this does not exclude a viral pathology it does raise the likelihood of a bacterial pathology so we will do azithromycin, prednisone and she will do her oral decongestants at home.  I spent 30 minutes of total time managing this patient today, this includes chart review, face to face, and non-face to face time.  ____________________________________________ Ihor Austin. Benjamin Stain, M.D., ABFM., CAQSM., AME. Primary Care and Sports Medicine [facility]  Adjunct Professor of Family Medicine  [employer]  Restaurant manager, fast food

## 2023-05-03 LAB — HM PAP SMEAR: HM Pap smear: NEGATIVE

## 2023-07-28 ENCOUNTER — Encounter: Payer: Self-pay | Admitting: Family Medicine

## 2023-11-22 ENCOUNTER — Encounter: Payer: Self-pay | Admitting: Sports Medicine

## 2023-12-13 ENCOUNTER — Encounter: Payer: Self-pay | Admitting: Urgent Care

## 2023-12-13 ENCOUNTER — Ambulatory Visit (INDEPENDENT_AMBULATORY_CARE_PROVIDER_SITE_OTHER): Payer: Self-pay | Admitting: Urgent Care

## 2023-12-13 VITALS — BP 107/66 | HR 59 | Wt 141.0 lb

## 2023-12-13 DIAGNOSIS — J329 Chronic sinusitis, unspecified: Secondary | ICD-10-CM | POA: Diagnosis not present

## 2023-12-13 DIAGNOSIS — Z0000 Encounter for general adult medical examination without abnormal findings: Secondary | ICD-10-CM

## 2023-12-13 DIAGNOSIS — M791 Myalgia, unspecified site: Secondary | ICD-10-CM

## 2023-12-13 DIAGNOSIS — K130 Diseases of lips: Secondary | ICD-10-CM

## 2023-12-13 DIAGNOSIS — G4709 Other insomnia: Secondary | ICD-10-CM

## 2023-12-13 MED ORDER — FLUTICASONE PROPIONATE 50 MCG/ACT NA SUSP: 2.0000 | Freq: Every day | NASAL | 6 refills | Status: AC

## 2023-12-13 MED ORDER — CYCLOBENZAPRINE HCL 10 MG PO TABS: 10.0000 mg | ORAL_TABLET | Freq: Every day | ORAL | 6 refills | Status: AC

## 2023-12-13 MED ORDER — LEVOCETIRIZINE DIHYDROCHLORIDE 5 MG PO TABS: 5.0000 mg | ORAL_TABLET | Freq: Every evening | ORAL | 9 refills | Status: AC

## 2023-12-13 MED ORDER — TRIAMCINOLONE ACETONIDE 0.1 % EX CREA
1.0000 | TOPICAL_CREAM | Freq: Two times a day (BID) | CUTANEOUS | 0 refills | Status: AC
Start: 2023-12-13 — End: ?

## 2023-12-13 NOTE — Progress Notes (Unsigned)
 Complete physical exam  Patient: Regina Hogan   DOB: 02-28-1986   38 y.o. Female  MRN: [id number]  Subjective:    Chief Complaint  Patient presents with   Sinus Problem    Since January, left nostril clogged    Regina Hogan is a 38 y.o. female who presents today for a complete physical exam. She reports consuming a general diet. Home exercise routine includes cardio and weights 5/wk. She generally feels well. She reports sleeping well. She does have additional problems to discuss today.   Discussed the use of AI scribe software for clinical note transcription with the patient, who gave verbal consent to proceed.  History of Present Illness   Regina Hogan is a 38 year old female who presents with chronic left-sided sinus congestion and tension headaches.  She has experienced chronic sinus congestion primarily on the left side since January, with symptoms initially intermittent but becoming persistent since April or May. She describes episodes of inability to breathe through the left nostril and pressure, especially when leaning forward. She has not sought medical evaluation since her last visit. She has used a neti pot and cetirizine irregularly with minimal relief and tried a steroid nasal spray from her father, which seemed to help but was not prescribed to her.  She has a history of headaches managed with riboflavin and magnesium , which have been helpful. Recently, she has experienced tension headaches, attributing them to stress from work and family life. Her headaches were previously evaluated with a brain scan, which was normal.  In 2018, she had her tonsils removed due to acute tonsillitis and a developing right-sided intertonsillar abscess. At that time, a cyst was noted in her right maxillary sinus, but her mastoids and orbits were normal.  Her lips become red and bumpy, and she treats them with cold sore medication. She also has a history of cold sores.  Her sleep  has been inconsistent, with some improvement noted when using a cortisol-related drink regimen, which she discontinued due to cost. She currently uses melatonin for sleep.  She maintains a healthy lifestyle, working out five days a week with a combination of cardio and weights. 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 She reports feeling stressed, particularly with the start of the school year, and notes that her two older children have had conflicts, adding to her stress.  She has reduced her alcohol consumption, now drinking only on weekends, and has not smoked or used drugs. She had a Pap smear earlier this year, which was negative, and she follows up regularly with a gynecologist due to having an IUD.       Most recent fall risk assessment:    01/18/2022   11:16 AM  Fall Risk   Falls in the past year? 1  Number falls in past yr: 0  Injury with Fall? 1  Risk for fall due to : Impaired balance/gait  Follow up Falls evaluation completed;Education provided      Data saved with a previous flowsheet row definition     Most recent depression screenings:    10/16/2019    9:52 AM 08/22/2019   10:06 AM  PHQ 2/9 Scores  PHQ - 2 Score 1 1  PHQ- 9 Score 1 1    Vision:Within last year and Dental: No current dental problems and Receives regular dental care  Patient Active Problem List   Diagnosis Date Noted   Sinobronchitis 02/01/2022   Well adult  on routine health check 01/18/2022   Tonsillar abscess 01/20/2017   Tonsillitis 06/08/2016   Seasonal allergies 06/08/2016   Eustachian tube dysfunction 01/30/2014   Past Medical History:  Diagnosis Date   Allergy 2006   Seasonal allergies 06/08/2016   Past Surgical History:  Procedure Laterality Date   TONSILLECTOMY     Social History   Tobacco Use   Smoking status: Never   Smokeless tobacco: Never  Vaping Use   Vaping status: Never Used  Substance Use Topics   Alcohol use:  Yes    Alcohol/week: 0.0 standard drinks of alcohol    Comment: 4 dirnks per week or so   Drug use: No      Patient Care Team: Lowella Benton CROME, [place] as PCP - General (Physician Assistant)   Outpatient Medications Prior to Visit  Medication Sig   cetirizine (ZYRTEC) 10 MG tablet Take 10 mg by mouth daily.   Magnesium  300 MG CAPS One by mouth daily.   Riboflavin (VITAMIN B2 PO) Take by mouth.   [DISCONTINUED] azithromycin  (ZITHROMAX  Z-PAK) 250 MG tablet Take 2 tablets (500 mg) on  Day 1,  followed by 1 tablet (250 mg) once daily on Days 2 through 5. (Patient not taking: Reported on 12/13/2023)   [DISCONTINUED] cyanocobalamin 100 MCG tablet Take 100 mcg by mouth daily. (Patient not taking: Reported on 12/13/2023)   [DISCONTINUED] fluticasone  (FLONASE ) 50 MCG/ACT nasal spray Place into both nostrils daily. (Patient not taking: Reported on 12/13/2023)   [DISCONTINUED] predniSONE  (DELTASONE ) 50 MG tablet Take 1 tablet (50 mg total) by mouth daily. (Patient not taking: Reported on 12/13/2023)   [DISCONTINUED] Probiotic Product (ACIDOPHILUS/GOAT MILK) CAPS Take by mouth. (Patient not taking: Reported on 12/13/2023)   No facility-administered medications prior to visit.    ROS Complete 12 point ROS performed with all pertinent positives listed in HPI      Objective:     BP 107/66   Pulse (!) 59   Wt 141 lb (64 kg)   SpO2 100%   BMI 24.20 kg/m  BP Readings from Last 3 Encounters:  12/13/23 107/66  12/16/22 113/73  02/01/22 123/67   Wt Readings from Last 3 Encounters:  12/13/23 141 lb (64 kg)  02/01/22 144 lb (65.3 kg)  01/18/22 140 lb (63.5 kg)      Physical Exam Vitals and nursing note reviewed.  Constitutional:      General: She is not in acute distress.    Appearance: Normal appearance. She is not ill-appearing, toxic-appearing or diaphoretic.  HENT:     Head: Normocephalic and atraumatic.     Right Ear: Tympanic membrane, ear canal and external ear normal. There is no  impacted cerumen.     Left Ear: Tympanic membrane, ear canal and external ear normal. There is no impacted cerumen.     Nose: Congestion (L nare) present.     Right Turbinates: Not enlarged or swollen.     Left Turbinates: Enlarged.     Right Sinus: No maxillary sinus tenderness or frontal sinus tenderness.     Left Sinus: Maxillary sinus tenderness present. No frontal sinus tenderness.     Mouth/Throat:     Lips: Pink. Lesions (angular cheilits noted to corners of mouth B) present.     Mouth: Mucous membranes are moist.     Pharynx: Oropharynx is clear. Uvula midline. No pharyngeal swelling, oropharyngeal exudate or posterior oropharyngeal erythema.  Eyes:     General: No scleral icterus.  Right eye: No discharge.        Left eye: No discharge.     Extraocular Movements: Extraocular movements intact.     Pupils: Pupils are equal, round, and reactive to light.  Neck:     Thyroid: No thyroid mass, thyromegaly or thyroid tenderness.  Cardiovascular:     Rate and Rhythm: Normal rate and regular rhythm.     Pulses: Normal pulses.     Heart sounds: No murmur heard. Pulmonary:     Effort: Pulmonary effort is normal. No respiratory distress.     Breath sounds: Normal breath sounds. No stridor. No wheezing or rhonchi.  Abdominal:     General: Abdomen is flat. Bowel sounds are normal. There is no distension.     Palpations: Abdomen is soft. There is no mass.     Tenderness: There is no abdominal tenderness. There is no guarding.  Musculoskeletal:     Cervical back: Normal range of motion and neck supple. Tenderness (muscle tension/ knotted traps/ paracervicals noted bilaterally) present. No rigidity.     Right lower leg: No edema.     Left lower leg: No edema.  Lymphadenopathy:     Cervical: No cervical adenopathy.  Skin:    General: Skin is warm and dry.     Coloration: Skin is not jaundiced.     Findings: No bruising, erythema or rash.  Neurological:     General: No focal  deficit present.     Mental Status: She is alert and oriented to person, place, and time.     Sensory: No sensory deficit.     Motor: No weakness.  Psychiatric:        Mood and Affect: Mood normal.        Behavior: Behavior normal.          Assessment & Plan:    Routine Health Maintenance and Physical Exam  Immunization History  Administered Date(s) Administered   Influenza Split 12/15/2012   Influenza, Quadrivalent, Recombinant, Inj, Pf 12/02/2013   Influenza, Seasonal, Injecte, Preservative Fre 12/02/2013   Influenza,inj,Quad PF,6+ Mos 01/23/2015   Influenza,inj,quad, With Preservative 01/23/2015, 12/16/2017   Influenza-Unspecified 01/04/2016, 12/20/2016   Janssen (J&J) SARS-COV-2 Vaccination 11/09/2019   PPD Test 09/30/2015   Tdap 06/24/2014, 09/30/2015    Health Maintenance  Topic Date Due   Hepatitis B Vaccines 19-59 Average Risk (1 of 3 - 19+ 3-dose series) Never done   COVID-19 Vaccine (2 - 2025-26 season) 12/30/2023 (Originally 11/21/2023)   Influenza Vaccine  06/19/2024 (Originally 10/21/2023)   HPV VACCINES (1 - 3-dose SCDM series) 12/13/2024 (Originally 11/26/2012)   Hepatitis C Screening  12/13/2024 (Originally 11/27/2003)   DTaP/Tdap/Td (3 - Td or Tdap) 09/29/2025   Cervical Cancer Screening (HPV/Pap Cotest)  05/02/2026   HIV Screening  Completed   Pneumococcal Vaccine  Aged Out   Meningococcal B Vaccine  Aged Out    Discussed health benefits of physical activity, and encouraged her to engage in regular exercise appropriate for her age and condition.  Problem List Items Addressed This Visit     Well adult on routine health check - Primary   Relevant Orders   CBC with Differential/Platelet (Completed)   Hemoglobin A1c (Completed)   TSH (Completed)   Lipid panel (Completed)   Comprehensive metabolic panel with GFR (Completed)   Other Visit Diagnoses       Chronic congestion of paranasal sinus       Relevant Medications   fluticasone  (FLONASE ) 50 MCG/ACT  nasal spray  levocetirizine (XYZAL ) 5 MG tablet     Angular cheilitis       Relevant Medications   triamcinolone  cream (KENALOG ) 0.1 %   Other Relevant Orders   TSH (Completed)   B12 and Folate Panel (Completed)     Muscle tension pain       Relevant Medications   cyclobenzaprine  (FLEXERIL ) 10 MG tablet     Other insomnia       Relevant Medications   levocetirizine (XYZAL ) 5 MG tablet   cyclobenzaprine  (FLEXERIL ) 10 MG tablet      No follow-ups on file. Assessment and Plan    Adult Wellness Visit Routine wellness visit focused on diet, exercise, and stress management. Recent Pap smear negative. - Update flu vaccine on December 15, 2023. - Discussed COVID vaccine, HIV screening, and hepatitis C screening, currently deferred. - Encourage continued healthy diet and regular physical activity.  Chronic left-sided maxillary sinusitis Chronic sinusitis with nasal congestion and pressure. Differential includes nasal polyps or abnormal bone growth. Flonase  provided some relief. - Prescribe xyzal  for chronic congestion. - Prescribe prescription Flonase . - Consider sinus series x-ray or CT if symptoms persist.  Tension-type headache Tension headaches possibly exacerbated by stress and sinus issues. Managed with riboflavin and magnesium . Discussed cyclobenzaprine  for tension relief and sleep aid. - Prescribe cyclobenzaprine  as needed for tension and sleep. - Advise use of heat therapy and ischemic release techniques for muscle tension.  Angular cheilitis Angular cheilitis with burning and redness. Cold sore medication used for relief. - Check B12 and thyroid levels. - start triamcinolone  prn  Insomnia Insomnia potentially related to stress and tension. Previous use of melatonin and herbal supplements. Discussed xyzal  and cyclobenzaprine  for sleep improvement. - Prescribe levocetirizine for sinus and sleep improvement. - Consider cyclobenzaprine  for additional sleep aid if needed.           Benton LITTIE Gave, PA

## 2023-12-13 NOTE — Patient Instructions (Signed)
 We completed your annual wellness visit today.  For your sinuses: Please start flonase  1-2 sprays per nostril daily. Start xyzal  at night. Try steam from a hot shower. Eucalyptus can also be helpful.   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 Please apply a warm moist compress, such as a microwavable heating pack, to your neck several times daily. After each warm compress, apply the technique that we discussed today of ischemic release. This is a prolonged, deep pressure into the knot of the muscle to release the tension. Take the muscle relaxer as needed. Keep in mind it may make you feel tired or drowsy, so do not operate machinery or drive a car until you know how it affects you.  Try to stay hydrated with WATER as dehydration and caffeine intake can worsen this condition.   Send me a mychart message if your sinuses persist >3 weeks and we will order a CT Scan.

## 2023-12-14 ENCOUNTER — Ambulatory Visit: Payer: Self-pay | Admitting: Urgent Care

## 2023-12-14 LAB — COMPREHENSIVE METABOLIC PANEL WITH GFR
ALT: 16 IU/L (ref 0–32)
AST: 23 IU/L (ref 0–40)
Albumin: 4.2 g/dL (ref 3.9–4.9)
Alkaline Phosphatase: 40 IU/L — ABNORMAL LOW (ref 41–116)
BUN/Creatinine Ratio: 18 (ref 9–23)
BUN: 16 mg/dL (ref 6–20)
Bilirubin Total: 1.2 mg/dL (ref 0.0–1.2)
CO2: 22 mmol/L (ref 20–29)
Calcium: 9.3 mg/dL (ref 8.7–10.2)
Chloride: 101 mmol/L (ref 96–106)
Creatinine, Ser: 0.91 mg/dL (ref 0.57–1.00)
Globulin, Total: 2.5 g/dL (ref 1.5–4.5)
Glucose: 86 mg/dL (ref 70–99)
Potassium: 3.9 mmol/L (ref 3.5–5.2)
Sodium: 139 mmol/L (ref 134–144)
Total Protein: 6.7 g/dL (ref 6.0–8.5)
eGFR: 83 mL/min/1.73 (ref >59)

## 2023-12-14 LAB — CBC WITH DIFFERENTIAL/PLATELET
Basophils Absolute: 0.1 x10E3/uL (ref 0.0–0.2)
Basos: 1 %
EOS (ABSOLUTE): 0.3 x10E3/uL (ref 0.0–0.4)
Eos: 4 %
Hematocrit: 43.4 % (ref 34.0–46.6)
Hemoglobin: 13.9 g/dL (ref 11.1–15.9)
Immature Grans (Abs): 0 x10E3/uL (ref 0.0–0.1)
Immature Granulocytes: 0 %
Lymphocytes Absolute: 1.9 x10E3/uL (ref 0.7–3.1)
Lymphs: 26 %
MCH: 30.8 pg (ref 26.6–33.0)
MCHC: 32 g/dL (ref 31.5–35.7)
MCV: 96 fL (ref 79–97)
Monocytes Absolute: 0.4 x10E3/uL (ref 0.1–0.9)
Monocytes: 6 %
Neutrophils Absolute: 4.5 x10E3/uL (ref 1.4–7.0)
Neutrophils: 63 %
Platelets: 244 x10E3/uL (ref 150–450)
RBC: 4.52 x10E6/uL (ref 3.77–5.28)
RDW: 12.2 % (ref 11.7–15.4)
WBC: 7.1 x10E3/uL (ref 3.4–10.8)

## 2023-12-14 LAB — LIPID PANEL
Chol/HDL Ratio: 2.6 ratio (ref 0.0–4.4)
Cholesterol, Total: 184 mg/dL (ref 100–199)
HDL: 72 mg/dL (ref >39)
LDL Chol Calc (NIH): 102 mg/dL — ABNORMAL HIGH (ref 0–99)
Triglycerides: 52 mg/dL (ref 0–149)
VLDL Cholesterol Cal: 10 mg/dL (ref 5–40)

## 2023-12-14 LAB — HEMOGLOBIN A1C
Est. average glucose Bld gHb Est-mCnc: 91 mg/dL
Hgb A1c MFr Bld: 4.8 % (ref 4.8–5.6)

## 2023-12-14 LAB — TSH: TSH: 2.94 u[IU]/mL (ref 0.450–4.500)

## 2023-12-14 LAB — B12 AND FOLATE PANEL
Folate: 13 ng/mL (ref >3.0)
Vitamin B-12: 352 pg/mL (ref 232–1245)
